# Patient Record
Sex: Male | Born: 1945 | Race: White | Hispanic: No | Marital: Married | Smoking: Never smoker
Health system: Southern US, Community
[De-identification: ages and names within clinical notes are randomized; demographics above are authoritative.]

## PROBLEM LIST (undated history)

## (undated) DIAGNOSIS — F319 Bipolar disorder, unspecified: Secondary | ICD-10-CM

## (undated) DIAGNOSIS — K219 Gastro-esophageal reflux disease without esophagitis: Secondary | ICD-10-CM

## (undated) DIAGNOSIS — L570 Actinic keratosis: Secondary | ICD-10-CM

## (undated) DIAGNOSIS — I10 Essential (primary) hypertension: Secondary | ICD-10-CM

## (undated) HISTORY — DX: Actinic keratosis: L57.0

## (undated) HISTORY — PX: HERNIA REPAIR: SHX51

## (undated) HISTORY — PX: COLONOSCOPY: SHX174

## (undated) HISTORY — PX: CYST EXCISION: SHX5701

---

## 2005-10-30 ENCOUNTER — Emergency Department: Payer: Self-pay | Admitting: Emergency Medicine

## 2006-02-08 ENCOUNTER — Ambulatory Visit: Payer: Self-pay | Admitting: Internal Medicine

## 2011-01-16 ENCOUNTER — Ambulatory Visit: Payer: Self-pay | Admitting: Internal Medicine

## 2012-01-30 ENCOUNTER — Emergency Department: Payer: Self-pay | Admitting: Internal Medicine

## 2012-02-06 ENCOUNTER — Emergency Department: Payer: Self-pay | Admitting: Emergency Medicine

## 2012-10-11 ENCOUNTER — Ambulatory Visit: Payer: Self-pay | Admitting: Neurology

## 2018-02-21 ENCOUNTER — Other Ambulatory Visit: Payer: Self-pay | Admitting: Pathology

## 2018-02-21 DIAGNOSIS — R55 Syncope and collapse: Secondary | ICD-10-CM

## 2018-02-25 ENCOUNTER — Other Ambulatory Visit: Payer: Self-pay

## 2018-02-25 ENCOUNTER — Ambulatory Visit (HOSPITAL_COMMUNITY): Payer: No Typology Code available for payment source | Attending: Cardiology

## 2018-02-25 DIAGNOSIS — R55 Syncope and collapse: Secondary | ICD-10-CM | POA: Insufficient documentation

## 2019-03-06 DIAGNOSIS — D239 Other benign neoplasm of skin, unspecified: Secondary | ICD-10-CM

## 2019-03-06 HISTORY — DX: Other benign neoplasm of skin, unspecified: D23.9

## 2019-04-20 ENCOUNTER — Ambulatory Visit: Payer: Non-veteran care | Attending: Internal Medicine

## 2019-04-20 DIAGNOSIS — Z23 Encounter for immunization: Secondary | ICD-10-CM

## 2019-04-20 NOTE — Progress Notes (Signed)
   Covid-19 Vaccination Clinic  Name:  Zachary Gardner    MRN: [id number] DOB: [birth year]  04/20/2019  [facility] was observed post Covid-19 immunization for 15 minutes without incidence. He was provided with Vaccine Information Sheet and instruction to access the V-Safe system.   [facility] was instructed to call 911 with any severe reactions post vaccine: Marland Kitchen Difficulty breathing  . Swelling of your face and throat  . A fast heartbeat  . A bad rash all over your body  . Dizziness and weakness    Immunizations Administered    Name Date Dose VIS Date Route   Moderna COVID-19 Vaccine 04/20/2019  3:01 PM 0.5 mL 01/27/2019 Intramuscular   Manufacturer: Moderna   Lot: [id number]   BartlettPO:[id number]

## 2019-05-19 ENCOUNTER — Ambulatory Visit: Payer: Non-veteran care | Attending: Internal Medicine

## 2019-05-19 DIAGNOSIS — Z23 Encounter for immunization: Secondary | ICD-10-CM

## 2019-05-19 NOTE — Progress Notes (Signed)
   Covid-19 Vaccination Clinic  Name:  Z[facility]    MRN: [id number] DOB: Mar 20, 1945  05/19/2019  [facility] was observed post Covid-19 immunization for 15 minutes without incident. He was provided with Vaccine Information Sheet and instruction to access the V-Safe system.   [facility] was instructed to call 911 with any severe reactions post vaccine: Marland Kitchen Difficulty breathing  . Swelling of face and throat  . A fast heartbeat  . A bad rash all over body  . Dizziness and weakness   Immunizations Administered    Name Date Dose VIS Date Route   Moderna COVID-19 Vaccine 05/19/2019  3:23 PM 0.5 mL 01/27/2019 Intramuscular   Manufacturer: Levan Hurst   LotUT:[id number]   WilsonPO:[id number]

## 2019-08-14 ENCOUNTER — Encounter: Payer: Self-pay | Admitting: *Deleted

## 2019-08-14 ENCOUNTER — Other Ambulatory Visit: Payer: Self-pay

## 2019-08-14 DIAGNOSIS — R339 Retention of urine, unspecified: Secondary | ICD-10-CM | POA: Insufficient documentation

## 2019-08-14 LAB — URINALYSIS, COMPLETE (UACMP) WITH MICROSCOPIC
Bacteria, UA: NONE SEEN
Bilirubin Urine: NEGATIVE
Glucose, UA: NEGATIVE mg/dL
Ketones, ur: NEGATIVE mg/dL
Leukocytes,Ua: NEGATIVE
Nitrite: NEGATIVE
Protein, ur: NEGATIVE mg/dL
Specific Gravity, Urine: 1.014 (ref 1.005–1.030)
Squamous Epithelial / HPF: NONE SEEN (ref 0–5)
pH: 6 (ref 5.0–8.0)

## 2019-08-14 NOTE — ED Triage Notes (Signed)
Pt to ED after a hernia repair and has arrived to ED after 5 hours of urinary retention. Pt has been able to urinate a couple drops but is having pressure in his penis more specifically the tip. No blood in urine.

## 2019-08-15 ENCOUNTER — Emergency Department
Admission: EM | Admit: 2019-08-15 | Discharge: 2019-08-15 | Disposition: A | Discharge status: Home / Self Care | Payer: No Typology Code available for payment source | Attending: Emergency Medicine | Admitting: Emergency Medicine

## 2019-08-15 DIAGNOSIS — R338 Other retention of urine: Secondary | ICD-10-CM

## 2019-08-15 NOTE — ED Notes (Signed)
Reviewed discharge instructions, follow-up care, and catheter care with patient. Patient verbalized understanding of all information reviewed. Patient stable, with no distress noted at this time.

## 2019-08-15 NOTE — ED Provider Notes (Signed)
[facility] Emergency Department Provider Note  ____________________________________________   First MD Initiated Contact with Patient 08/15/19 0109     (approximate)  I have reviewed the triage vital signs and the nursing notes.   HISTORY  Chief Complaint Urinary Retention    HPI [facility] is a 74 y.o. male who presents for evaluation of inability to urinate.  He states that he had left inguinal hernia surgery at the [place] this morning.  He is not certain if the had him urinate before he left.  However since he came home he has been unable to urinate.  By the time he came to the emergency department he was in severe distress due to suprapubic pain and pressure and abdominal distention.  He has no other symptoms and said he seems to be doing well from surgery.  He denies nausea and vomiting, fever, chest pain, and shortness of breath.  He has had issues with urination in the past but never been obstructed or had retention requiring an indwelling Foley catheter.  Nothing in particular made the symptoms better or worse.  He had a Foley catheter placed in triage and immediate return of about 800 mL of urine and now he feels asymptomatic.         History reviewed. No pertinent past medical history.  There are no problems to display for this patient.   Past Surgical History:  Procedure Laterality Date  . HERNIA REPAIR  2004    Prior to Admission medications   Not on File    Allergies Patient has no known allergies.  History reviewed. No pertinent family history.  Social History Social History   Tobacco Use  . Smoking status: Never Smoker  . Smokeless tobacco: Never Used  Substance Use Topics  . Alcohol use: Never  . Drug use: Never    Review of Systems Constitutional: No fever/chills Eyes: No visual changes. ENT: No sore throat. Cardiovascular: Denies chest pain. Respiratory: Denies shortness of breath. Gastrointestinal: Suprapubic  pain and abdominal distention.  No nausea, no vomiting.  No diarrhea.  No constipation. Genitourinary: Urinary retention and lower abdominal distention.  Negative for dysuria. Musculoskeletal: Negative for neck pain.  Negative for back pain. Integumentary: Negative for rash. Neurological: Negative for headaches, focal weakness or numbness.   ____________________________________________   PHYSICAL EXAM:  VITAL SIGNS: ED Triage Vitals  Enc Vitals Group     BP 08/14/19 2012 (!) 151/86     Pulse Rate 08/14/19 2012 78     Resp 08/14/19 2012 16     Temp 08/14/19 2012 98.5 F (36.9 C)     Temp Source 08/14/19 2012 Oral     SpO2 08/14/19 2012 98 %     Weight 08/14/19 2013 68.5 kg (151 lb)     Height 08/14/19 2013 1.727 m (5\' 8" )     Head Circumference --      Peak Flow --      Pain Score 08/14/19 2013 7     Pain Loc --      Pain Edu? --      Excl. in [place]? --     Constitutional: Alert and oriented.  Eyes: Conjunctivae are normal.  Head: Atraumatic. Nose: No congestion/rhinnorhea. Mouth/Throat: Patient is wearing a mask. Neck: No stridor.  No meningeal signs.   Cardiovascular: Normal rate, regular rhythm. Good peripheral circulation. Grossly normal heart sounds. Respiratory: Normal respiratory effort.  No retractions. Gastrointestinal: Soft and nontender. No distention.  Genitourinary: Foley catheter  is in place and drained more than 800 mL of normal-appearing urine.  The recent surgical wound in the left groin appears well, no dehiscence, no bleeding. Musculoskeletal: No lower extremity tenderness nor edema. No gross deformities of extremities. Neurologic:  Normal speech and language. No gross focal neurologic deficits are appreciated.  Skin:  Skin is warm, dry and intact. Psychiatric: Mood and affect are normal. Speech and behavior are normal.  ____________________________________________   LABS (all labs ordered are listed, but only abnormal results are displayed)  Labs  Reviewed  URINALYSIS, COMPLETE (UACMP) WITH MICROSCOPIC - Abnormal; Notable for the following components:      Result Value   Color, Urine YELLOW (*)    APPearance CLEAR (*)    Hgb urine dipstick SMALL (*)    All other components within normal limits   ____________________________________________  EKG  No indication for emergent EKG ____________________________________________  RADIOLOGY I, Hinda Kehr, personally viewed and evaluated these images (plain radiographs) as part of my medical decision making, as well as reviewing the written report by the radiologist.  ED MD interpretation: No indication for emergent imaging  Official radiology report(s): No results found.  ____________________________________________   PROCEDURES   Procedure(s) performed (including Critical Care):  Procedures   ____________________________________________   INITIAL IMPRESSION / MDM / [place] / ED COURSE  As part of my medical decision making, I reviewed the following data within the [place] notes reviewed and incorporated, Old chart reviewed and Notes from prior ED visits   Differential diagnosis includes, but is not limited to, urinary retention after recent anesthesia, prostate pathology, UTI.  I believe the symptoms are the result of the patient's general anesthesia.  He is asymptomatic with an indwelling catheter in place.  He drained a large amount of urine.  We had my usual and customary discussion about urinary retention and Foley catheter placement and the need for outpatient follow-up.  He understands and agrees with the plan.  No indication for antibiotics based on urinalysis which showed a few RBCs, likely traumatic, but no indication of infection and he was having no dysuria prior to the episode.           ____________________________________________  FINAL CLINICAL IMPRESSION(S) / ED DIAGNOSES  Final diagnoses:  Acute urinary  retention     MEDICATIONS GIVEN DURING THIS VISIT:  Medications - No data to display   ED Discharge Orders    None      *Please note:  Zachary Gardner was evaluated in Emergency Department on 08/15/2019 for the symptoms described in the history of present illness. He was evaluated in the context of the global COVID-19 pandemic, which necessitated consideration that the patient might be at risk for infection with the SARS-CoV-2 virus that causes COVID-19. Institutional protocols and algorithms that pertain to the evaluation of patients at risk for COVID-19 are in a state of rapid change based on information released by regulatory bodies including the CDC and federal and state organizations. These policies and algorithms were followed during the patient's care in the ED.  Some ED evaluations and interventions may be delayed as a result of limited staffing during and after the pandemic.*  Note:  This document was prepared using Dragon voice recognition software and may include unintentional dictation errors.   Hinda Kehr, MD 08/15/19 [phone]

## 2019-08-15 NOTE — Discharge Instructions (Signed)
You were seen in the Emergency Department (ED) for urinary retention.  We placed a Foley catheter to help your bladder drain.  Please read through the included information and follow up with your doctor as recommended in these papers; your doctor will see you in clinic and help you determine when it is time to have the catheter removed.  If you stop producing urine in the bag or if you develop other symptoms that concern you, such as fever, chills, persistent vomiting, or severe abdominal pain, please return immediately to the Emergency Department.  ° °

## 2019-08-26 ENCOUNTER — Ambulatory Visit: Payer: Medicare HMO | Admitting: Urology

## 2019-08-26 ENCOUNTER — Other Ambulatory Visit: Payer: Self-pay

## 2019-08-26 ENCOUNTER — Encounter: Payer: Self-pay | Admitting: Urology

## 2019-08-26 ENCOUNTER — Ambulatory Visit (INDEPENDENT_AMBULATORY_CARE_PROVIDER_SITE_OTHER): Payer: Medicare HMO | Admitting: Physician Assistant

## 2019-08-26 VITALS — BP 163/75 | HR 76 | Ht 68.0 in | Wt 155.0 lb

## 2019-08-26 DIAGNOSIS — N401 Enlarged prostate with lower urinary tract symptoms: Secondary | ICD-10-CM | POA: Diagnosis not present

## 2019-08-26 DIAGNOSIS — R339 Retention of urine, unspecified: Secondary | ICD-10-CM

## 2019-08-26 LAB — BLADDER SCAN AMB NON-IMAGING: Scan Result: 512

## 2019-08-26 NOTE — Progress Notes (Signed)
Simple Catheter Placement  Due to urinary retention patient is present today for a foley cath placement.  Patient was cleaned and prepped in a sterile fashion with betadine and 2% lidocaine jelly was instilled into the urethra. A 16 FR coude foley catheter was inserted, urine return was noted  438ml, urine was yellow in color.  The balloon was filled with 10cc of sterile water.  A leg bag was attached for drainage. Patient was also given a night bag to take home and was given instruction on how to change from one bag to another.  Patient was given instruction on proper catheter care.  Patient tolerated well, no complications were noted   Performed by: Debroah Loop, PA-C

## 2019-08-26 NOTE — Progress Notes (Signed)
Afternoon follow-up  Patient returned to clinic this afternoon for repeat PVR. He reports drinking approximately 32oz of fluid. He has been able to urinate with decreased output with each void throughout the day. PVR 579mL; he reports significant penile and perineal pain since catheter removal.  Results for orders placed or performed in visit on 08/26/19  BLADDER SCAN AMB NON-IMAGING  Result Value Ref Range   Scan Result 512 ML     Voiding trial failed. Offered patient Foley replacement with repeat voiding trial on silodosin in one week. He agreed; see separate procedure note for details. Urine sample obtained with reassuring UA. Patient reports resolution of pain with repeat catheterization.  Follow up: Return in about 1 week (around 09/02/2019) for Voiding trial.

## 2019-08-26 NOTE — Patient Instructions (Signed)
Indwelling Urinary Catheter Care, Adult An indwelling urinary catheter is a thin, flexible, germ-free (sterile) tube that is placed into the bladder to help drain urine out of the body. The catheter is inserted into the part of the body that drains urine from the bladder (urethra). Urine drains from the catheter into a drainage bag outside of the body. Taking good care of your catheter will keep it working properly and help to prevent problems from developing. What are the risks?  Bacteria may get into your bladder and cause a urinary tract infection.  Urine flow can become blocked. This can happen if the catheter is not working correctly, or if you have sediment or a blood clot in your bladder or the catheter.  Tissue near the catheter may become irritated and bleed. How to wear your catheter and your drainage bag Supplies needed  Adhesive tape or a leg strap.  Alcohol wipe or soap and water (if you use tape).  A clean towel (if you use tape).  Overnight drainage bag.  Smaller drainage bag (leg bag). Wearing your catheter and bag Use adhesive tape or a leg strap to attach your catheter to your leg.  Make sure the catheter is not pulled tight.  If a leg strap gets wet, replace it with a dry one.  If you use adhesive tape: 1. Use an alcohol wipe or soap and water to wash off any stickiness on your skin where you had tape before. 2. Use a clean towel to pat-dry the area. 3. Apply the new tape. You should have received a large overnight drainage bag and a smaller leg bag that fits underneath clothing.  You may wear the overnight bag at any time, but you should not wear the leg bag at night.  Always wear the leg bag below your knee.  Make sure the overnight drainage bag is always lower than the level of your bladder, but do not let it touch the floor. Before you go to sleep, hang the bag inside a wastebasket that is covered by a clean plastic bag. How to care for your skin around  the catheter     Supplies needed  A clean washcloth.  Water and mild soap.  A clean towel. Caring for your skin and catheter  Every day, use a clean washcloth and soapy water to clean the skin around your catheter. 1. Wash your hands with soap and water. 2. Wet a washcloth in warm water and mild soap. 3. Clean the skin around your urethra.  If you are male:  Use one hand to gently spread the folds of skin around your vagina (labia).  With the washcloth in your other hand, wipe the inner side of your labia on each side. Do this in a front-to-back direction.  If you are male:  Use one hand to pull back any skin that covers the end of your penis (foreskin).  With the washcloth in your other hand, wipe your penis in small circles. Start wiping at the tip of your penis, then move outward from the catheter.  Move the foreskin back in place, if this applies. 4. With your free hand, hold the catheter close to where it enters your body. Keep holding the catheter during cleaning so it does not get pulled out. 5. Use your other hand to clean the catheter with the washcloth.  Only wipe downward on the catheter.  Do not wipe upward toward your body, because that may push bacteria into your urethra   and cause infection. 6. Use a clean towel to pat-dry the catheter and the skin around it. Make sure to wipe off all soap. 7. Wash your hands with soap and water.  Shower every day. Do not take baths.  Do not use cream, ointment, or lotion on the area where the catheter enters your body, unless your health care provider tells you to do that.  Do not use powders, sprays, or lotions on your genital area.  Check your skin around the catheter every day for signs of infection. Check for: ? Redness, swelling, or pain. ? Fluid or blood. ? Warmth. ? Pus or a bad smell. How to empty the drainage bag Supplies needed  Rubbing alcohol.  Gauze pad or cotton ball.  Adhesive tape or a leg  strap. Emptying the bag Empty your drainage bag (your overnight drainage bag or your leg bag) when it is ?- full, or at least 2-3 times a day. Clean the drainage bag according to the manufacturer's instructions or as told by your health care provider. 1. Wash your hands with soap and water. 2. Detach the drainage bag from your leg. 3. Hold the drainage bag over the toilet or a clean container. Make sure the drainage bag is lower than your hips and bladder. This stops urine from going back into the tubing and into your bladder. 4. Open the pour spout at the bottom of the bag. 5. Empty the urine into the toilet or container. Do not let the pour spout touch any surface. This precaution is important to prevent bacteria from getting in the bag and causing infection. 6. Apply rubbing alcohol to a gauze pad or cotton ball. 7. Use the gauze pad or cotton ball to clean the pour spout. 8. Close the pour spout. 9. Attach the bag to your leg with adhesive tape or a leg strap. 10. Wash your hands with soap and water. How to change the drainage bag Supplies needed:  Alcohol wipes.  A clean drainage bag.  Adhesive tape or a leg strap. Changing the bag Replace your drainage bag with a clean bag if it leaks, starts to smell bad, or looks dirty. 1. Wash your hands with soap and water. 2. Detach the dirty drainage bag from your leg. 3. Pinch the catheter with your fingers so that urine does not spill out. 4. Disconnect the catheter tube from the drainage tube at the connection valve. Do not let the tubes touch any surface. 5. Clean the end of the catheter tube with an alcohol wipe. Use a different alcohol wipe to clean the end of the drainage tube. 6. Connect the catheter tube to the drainage tube of the clean bag. 7. Attach the clean bag to your leg with adhesive tape or a leg strap. Avoid attaching the new bag too tightly. 8. Wash your hands with soap and water. General instructions   Never pull on  your catheter or try to remove it. Pulling can damage your internal tissues.  Always wash your hands before and after you handle your catheter or drainage bag. Use a mild, fragrance-free soap. If soap and water are not available, use hand sanitizer.  Always make sure there are no twists or bends (kinks) in the catheter tube.  Always make sure there are no leaks in the catheter or drainage bag.  Drink enough fluid to keep your urine pale yellow.  Do not take baths, swim, or use a hot tub.  If you are male, wipe from   front to back after having a bowel movement. Contact a health care provider if:  Your urine is cloudy.  Your urine smells unusually bad.  Your catheter gets clogged.  Your catheter starts to leak.  Your bladder feels full. Get help right away if:  You have redness, swelling, or pain where the catheter enters your body.  You have fluid, blood, pus, or a bad smell coming from the area where the catheter enters your body.  The area where the catheter enters your body feels warm to the touch.  You have a fever.  You have pain in your abdomen, legs, lower back, or bladder.  You see blood in the catheter.  Your urine is pink or red.  You have nausea, vomiting, or chills.  Your urine is not draining into the bag.  Your catheter gets pulled out. Summary  An indwelling urinary catheter is a thin, flexible, germ-free (sterile) tube that is placed into the bladder to help drain urine out of the body.  The catheter is inserted into the part of the body that drains urine from the bladder (urethra).  Take good care of your catheter to keep it working properly and help prevent problems from developing.  Always wash your hands before and after you handle your catheter or drainage bag.  Never pull on your catheter or try to remove it. This information is not intended to replace advice given to you by your health care provider. Make sure you discuss any questions  you have with your health care provider. Document Revised: 06/06/2018 Document Reviewed: 09/28/2016 Elsevier Patient Education  2020 Elsevier Inc.  

## 2019-08-26 NOTE — Progress Notes (Signed)
° °  08/26/19 8:33 AM   Zachary Gardner [birth year] [id number]  Referring provider: Keane Police, MD [address] [place],  [place] [address] Chief Complaint  Patient presents with   Urinary Retention    HPI: Zachary Gardner is a 74 y.o. male who presents today for urinary retention follow up.  -Underwent a left inquinal hernia repair at the [facility] [place] on 08/15/19 -Presented to in ED for acute urinary retention and foley catheter was placed -Prior history of BPH, was on tamsulosin and finasteride -Was taken off tamsulosin approximally 15 months ago and did note increased frequency and nocturia after this medication was discontinued -No prior episodes of urinary retention   PMH: No past medical history on file.  Surgical History: Past Surgical History:  Procedure Laterality Date   HERNIA REPAIR  2004    Home Medications:  Allergies as of 08/26/2019   No Known Allergies     Medication List       Accurate as of August 26, 2019  8:33 AM. If you have any questions, ask your nurse or doctor.        finasteride 5 MG tablet Commonly known as: PROSCAR   loperamide 2 MG capsule Commonly known as: IMODIUM Take by mouth.   Omeprazole 20 MG Tbec       Allergies: No Known Allergies  Family History: No family history on file.  Social History:  reports that he has never smoked. He has never used smokeless tobacco. He reports that he does not drink alcohol and does not use drugs.   Physical Exam: BP (!) 163/75    Pulse 76    Ht 5\' 8"  (1.727 m)    Wt 155 lb (70.3 kg)    BMI 23.57 kg/m   Constitutional:  Alert and oriented, No acute distress. HEENT: [facility] AT, moist mucus membranes.  Trachea midline, no masses. Cardiovascular: No clubbing, cyanosis, or edema. Respiratory: Normal respiratory effort, no increased work of breathing. GI: Abdomen is soft, nontender, nondistended, no abdominal masses GU: No CVA tenderness. Penis without lesions. Testes descended bilaterally, no masses  or tenderness. Foley catheter drained, clear urine. Prostate is 60+ grams, smooth, no nodules. Lymph: No cervical or inguinal lymphadenopathy. Skin: No rashes, bruises or suspicious lesions. Neurologic: Grossly intact, no focal deficits, moving all 4 extremities. Psychiatric: Normal mood and affect.   Assessment & Plan:   1. 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 Nicki Reaper C. Ulises Wolfinger.  I have reviewed the above documentation for accuracy and completeness, and I agree with the above.   Abbie Sons, MD

## 2019-08-27 ENCOUNTER — Encounter: Payer: Self-pay | Admitting: Urology

## 2019-08-27 LAB — MICROSCOPIC EXAMINATION: Bacteria, UA: NONE SEEN

## 2019-08-27 LAB — URINALYSIS, COMPLETE
Bilirubin, UA: NEGATIVE
Glucose, UA: NEGATIVE
Ketones, UA: NEGATIVE
Leukocytes,UA: NEGATIVE
Nitrite, UA: NEGATIVE
Protein,UA: NEGATIVE
Specific Gravity, UA: 1.01 (ref 1.005–1.030)
Urobilinogen, Ur: 0.2 mg/dL (ref 0.2–1.0)
pH, UA: 7 (ref 5.0–7.5)

## 2019-08-27 MED ORDER — SILODOSIN 8 MG PO CAPS: 8.0000 mg | ORAL_CAPSULE | Freq: Every day | ORAL | 3 refills | Status: DC

## 2019-09-03 ENCOUNTER — Ambulatory Visit: Payer: Medicare HMO | Admitting: Physician Assistant

## 2019-09-03 ENCOUNTER — Other Ambulatory Visit: Payer: Self-pay

## 2019-09-03 ENCOUNTER — Encounter: Payer: Self-pay | Admitting: Physician Assistant

## 2019-09-03 ENCOUNTER — Ambulatory Visit: Payer: Self-pay | Admitting: Physician Assistant

## 2019-09-03 VITALS — BP 153/93 | HR 73 | Ht 68.0 in | Wt 153.2 lb

## 2019-09-03 DIAGNOSIS — R600 Localized edema: Secondary | ICD-10-CM

## 2019-09-03 DIAGNOSIS — R339 Retention of urine, unspecified: Secondary | ICD-10-CM

## 2019-09-03 LAB — BLADDER SCAN AMB NON-IMAGING: Scan Result: 42

## 2019-09-03 NOTE — Progress Notes (Signed)
09/03/2019 4:32 PM   Zachary Gardner [birth year] [id number]  CC: Chief Complaint  Patient presents with  . Urinary Retention    HPI: Zachary Gardner is a 74 y.o. male with a recent history of urinary retention following left inguinal hernia repair at the [facility] who presents today for repeat voiding trial on finasteride and silodosin.  Today, patient reports tolerating his catheter well.  He is excited to see if he can urinate today.  Additionally, he reports right lower extremity edema x1 week with tenderness over the medial tibial plateau.  Notably, his catheter leg bag has been strapped to his right leg with an elastic strap just inferior to the patella.  PMH: No past medical history on file.  Surgical History: Past Surgical History:  Procedure Laterality Date  . HERNIA REPAIR  2004    Home Medications:  Allergies as of 09/03/2019   No Known Allergies     Medication List       Accurate as of September 03, 2019  4:32 PM. If you have any questions, ask your nurse or doctor.        cyanocobalamin 1000 MCG tablet Take 1 tablet by mouth daily.   finasteride 5 MG tablet Commonly known as: PROSCAR   lithium 300 MG tablet Take by mouth.   loperamide 2 MG capsule Commonly known as: IMODIUM Take by mouth.   Omeprazole 20 MG Tbec   silodosin 8 MG Caps capsule Commonly known as: RAPAFLO Take 1 capsule (8 mg total) by mouth daily with breakfast.   simvastatin 40 MG tablet Commonly known as: ZOCOR Take by mouth.   traZODone 50 MG tablet Commonly known as: DESYREL Take by mouth.   Vitamin D3 1.25 MG (50000 UT) Caps Take by mouth.       Allergies:  No Known Allergies  Family History: No family history on file.  Social History:   reports that he has never smoked. He has never used smokeless tobacco. He reports that he does not drink alcohol and does not use drugs.  Physical Exam: BP (!) 153/93 (BP Location: Left Arm, Patient Position: Sitting, Cuff Size:  Normal)   Pulse 73   Ht 5\' 8"  (1.727 m)   Wt 153 lb 3.2 oz (69.5 kg)   BMI 23.29 kg/m   Constitutional:  Alert and oriented, no acute distress, nontoxic appearing HEENT: [place], AT Cardiovascular: No clubbing, cyanosis, or edema Respiratory: Normal respiratory effort, no increased work of breathing Skin: No rashes, bruises or suspicious lesions MSK: 2+ pitting edema of the right lower extremity below the level of the knee.  No notable erythema.  Some tenderness over the right medial tibial plateau. Neurologic: Grossly intact, no focal deficits, moving all 4 extremities Psychiatric: Normal mood and affect  Laboratory Data: Results for orders placed or performed in visit on 09/03/19  Bladder Scan (Post Void Residual) in office  Result Value Ref Range   Scan Result 42    Assessment & Plan:   1. Urinary retention Fill and pull voiding trial completed in the morning, see separate procedure note for details.  Patient returned to clinic this afternoon for repeat PVR. He reports drinking approximately 70oz of fluid. He has been able to urinate. PVR 24mL.  Voiding trial passed. Counseled patient to continue silodosin and finasteride daily.  We will plan for symptom recheck with PVR with Dr. Bernardo Heater in approximately 3 months.  Additionally, I counseled the patient that his history of urinary retention increases risk  for retention again in the future.  Counseled him to make sure that he is always taking both silodosin and finasteride, especially prior to surgery, and to make any future surgical teams aware of his history of retention.  He expressed understanding. - Bladder Scan (Post Void Residual) in office  2. Edema of right lower extremity No improvement in edema following removal of his catheter leg bag.  I encouraged him to follow-up with his PCP ASAP given his recent surgery and concern for possible DVT.  He expressed understanding.  Return in about 3 months (around 12/04/2019) for Symptom  recheck with IPSS, PVR with Dr. Bernardo Heater.  Debroah Loop, PA-C  [facility] [address] [place], [place] [address] [phone]

## 2019-09-03 NOTE — Progress Notes (Signed)
Fill and Pull Catheter Removal  Patient is present today for a catheter removal.  Patient was cleaned and prepped in a sterile fashion 282ml of sterile water was instilled into the bladder when the patient felt the urge to urinate. 24ml of water was then drained from the balloon.  A 16FR coude foley cath was removed from the bladder no complications were noted.  Patient was then given some time to void on their own.  Patient can void  274ml on their own after some time.  Patient tolerated well.  Performed by: Debroah Loop, PA-C   Follow up/ Additional notes: Push fluids and RTC this afternoon for PVR.

## 2019-09-09 ENCOUNTER — Ambulatory Visit: Payer: Self-pay | Admitting: Physician Assistant

## 2019-09-25 ENCOUNTER — Ambulatory Visit: Payer: Self-pay | Admitting: Urology

## 2019-10-13 ENCOUNTER — Other Ambulatory Visit: Payer: Self-pay

## 2019-10-13 ENCOUNTER — Encounter: Payer: Self-pay | Admitting: Physician Assistant

## 2019-10-13 ENCOUNTER — Ambulatory Visit (INDEPENDENT_AMBULATORY_CARE_PROVIDER_SITE_OTHER): Payer: No Typology Code available for payment source | Admitting: Physician Assistant

## 2019-10-13 VITALS — BP 134/71 | HR 78 | Ht 68.0 in | Wt 153.0 lb

## 2019-10-13 DIAGNOSIS — R339 Retention of urine, unspecified: Secondary | ICD-10-CM | POA: Diagnosis not present

## 2019-10-13 DIAGNOSIS — Z87898 Personal history of other specified conditions: Secondary | ICD-10-CM

## 2019-10-13 DIAGNOSIS — R300 Dysuria: Secondary | ICD-10-CM

## 2019-10-13 LAB — URINALYSIS, COMPLETE
Bilirubin, UA: NEGATIVE
Glucose, UA: NEGATIVE
Ketones, UA: NEGATIVE
Nitrite, UA: POSITIVE — AB
Specific Gravity, UA: 1.02 (ref 1.005–1.030)
Urobilinogen, Ur: 0.2 mg/dL (ref 0.2–1.0)
pH, UA: 7.5 (ref 5.0–7.5)

## 2019-10-13 LAB — MICROSCOPIC EXAMINATION: WBC, UA: >30 /hpf — AB (ref 0–5)

## 2019-10-13 LAB — BLADDER SCAN AMB NON-IMAGING

## 2019-10-13 MED ORDER — SULFAMETHOXAZOLE-TRIMETHOPRIM 800-160 MG PO TABS: 1.0000 | ORAL_TABLET | Freq: Two times a day (BID) | ORAL | 0 refills | Status: AC

## 2019-10-13 NOTE — Progress Notes (Signed)
10/13/2019 10:40 AM   Zachary Gardner January 19, 1946 [id number]  CC: Chief Complaint  Patient presents with  . Dysuria    UTI?    HPI: Zachary Gardner is a 74 y.o. male with PMH BPH on finasteride and silodosin and a recent history of postoperative urinary retention who passed a voiding trial on 09/03/2019 who presents today for evaluation of possible UTI.  Today he reports an approximate 3 to 4-week history of dysuria, urgency, frequency, occasional nausea, and cloudy urine.  He denies fever, chills, and vomiting.  Per chart review, most recent creatinine dated 03/27/2019 was 1.2.  In-office UA today positive for 2+ blood, trace protein, nitrites, and 3+ leukocyte esterase; urine microscopy with >30 WBCs/HPF, 3-10 RBCs/HPF, and many bacteria. PVR 62mL.  PMH: No past medical history on file.  Surgical History: Past Surgical History:  Procedure Laterality Date  . HERNIA REPAIR  2004    Home Medications:  Allergies as of 10/13/2019   No Known Allergies     Medication List       Accurate as of October 13, 2019 10:40 AM. If you have any questions, ask your nurse or doctor.        cyanocobalamin 1000 MCG tablet Take 1 tablet by mouth daily.   finasteride 5 MG tablet Commonly known as: PROSCAR   lithium 300 MG tablet Take by mouth.   loperamide 2 MG capsule Commonly known as: IMODIUM Take by mouth.   Omeprazole 20 MG Tbec   silodosin 8 MG Caps capsule Commonly known as: RAPAFLO Take 1 capsule (8 mg total) by mouth daily with breakfast.   simvastatin 40 MG tablet Commonly known as: ZOCOR Take by mouth.   sulfamethoxazole-trimethoprim 800-160 MG tablet Commonly known as: BACTRIM DS Take 1 tablet by mouth 2 (two) times daily for 7 days. Started by: Debroah Loop, PA-C   traZODone 50 MG tablet Commonly known as: DESYREL Take by mouth.   Vitamin D3 1.25 MG (50000 UT) Caps Take by mouth.       Allergies:  No Known Allergies  Family History: No  family history on file.  Social History:   reports that he has never smoked. He has never used smokeless tobacco. He reports that he does not drink alcohol and does not use drugs.  Physical Exam: BP 134/71   Pulse 78   Ht 5\' 8"  (1.727 m)   Wt 153 lb (69.4 kg)   BMI 23.26 kg/m   Constitutional:  Alert and oriented, no acute distress, nontoxic appearing HEENT: [name], AT Cardiovascular: No clubbing, cyanosis, or edema Respiratory: Normal respiratory effort, no increased work of breathing Skin: No rashes, bruises or suspicious lesions Neurologic: Grossly intact, no focal deficits, moving all 4 extremities Psychiatric: Normal mood and affect  Laboratory Data: Results for orders placed or performed in visit on 10/13/19  Microscopic Examination   Urine  Result Value Ref Range   WBC, UA >30 (A) 0 - 5 /hpf   RBC 3-10 (A) 0 - 2 /hpf   Epithelial Cells (non renal) 0-10 0 - 10 /hpf   Bacteria, UA Many (A) None seen/Few  Urinalysis, Complete  Result Value Ref Range   Specific Gravity, UA 1.020 1.005 - 1.030   pH, UA 7.5 5.0 - 7.5   Color, UA Yellow Yellow   Appearance Ur Cloudy (A) Clear   Leukocytes,UA 3+ (A) Negative   Protein,UA Trace (A) Negative/Trace   Glucose, UA Negative Negative   Ketones, UA Negative Negative  RBC, UA 2+ (A) Negative   Bilirubin, UA Negative Negative   Urobilinogen, Ur 0.2 0.2 - 1.0 mg/dL   Nitrite, UA Positive (A) Negative   Microscopic Examination See below:   BLADDER SCAN AMB NON-IMAGING  Result Value Ref Range   Scan Result 28ml    Assessment & Plan:   1. Dysuria UA grossly infected today, will start empiric Bactrim and send for culture for further evaluation.  He will require repeat UA to prove resolution of microscopic hematuria in 1 week. - Urinalysis, Complete - CULTURE, URINE COMPREHENSIVE - sulfamethoxazole-trimethoprim (BACTRIM DS) 800-160 MG tablet; Take 1 tablet by mouth 2 (two) times daily for 7 days.  Dispense: 14 tablet; Refill: 0  2.  History of urinary retention PVR reassuring today, no active retention.  No intervention indicated. - BLADDER SCAN AMB NON-IMAGING   Return in about 1 week (around 10/20/2019) for Lab visit for UA.  Debroah Loop, PA-C  [facility] [address], [place], [place] [address] [phone]

## 2019-10-19 ENCOUNTER — Other Ambulatory Visit: Payer: Self-pay

## 2019-10-19 ENCOUNTER — Telehealth: Payer: Self-pay

## 2019-10-19 DIAGNOSIS — R300 Dysuria: Secondary | ICD-10-CM

## 2019-10-19 LAB — CULTURE, URINE COMPREHENSIVE

## 2019-10-19 NOTE — Telephone Encounter (Signed)
ERROR

## 2019-10-20 ENCOUNTER — Other Ambulatory Visit: Payer: Self-pay

## 2019-10-20 ENCOUNTER — Other Ambulatory Visit: Payer: No Typology Code available for payment source

## 2019-10-20 DIAGNOSIS — R300 Dysuria: Secondary | ICD-10-CM

## 2019-10-21 LAB — URINALYSIS, COMPLETE
Bilirubin, UA: NEGATIVE
Glucose, UA: NEGATIVE
Ketones, UA: NEGATIVE
Leukocytes,UA: NEGATIVE
Nitrite, UA: NEGATIVE
Protein,UA: NEGATIVE
RBC, UA: NEGATIVE
Specific Gravity, UA: 1.02 (ref 1.005–1.030)
Urobilinogen, Ur: 1 mg/dL (ref 0.2–1.0)
pH, UA: 7 (ref 5.0–7.5)

## 2019-10-21 LAB — MICROSCOPIC EXAMINATION: Bacteria, UA: NONE SEEN

## 2019-10-30 ENCOUNTER — Other Ambulatory Visit: Payer: Self-pay

## 2019-10-30 ENCOUNTER — Ambulatory Visit (INDEPENDENT_AMBULATORY_CARE_PROVIDER_SITE_OTHER): Payer: No Typology Code available for payment source | Admitting: Physician Assistant

## 2019-10-30 VITALS — BP 156/80 | HR 71

## 2019-10-30 DIAGNOSIS — R300 Dysuria: Secondary | ICD-10-CM | POA: Diagnosis not present

## 2019-10-30 LAB — URINALYSIS, COMPLETE
Bilirubin, UA: NEGATIVE
Glucose, UA: NEGATIVE
Ketones, UA: NEGATIVE
Leukocytes,UA: NEGATIVE
Nitrite, UA: NEGATIVE
Protein,UA: NEGATIVE
RBC, UA: NEGATIVE
Specific Gravity, UA: 1.02 (ref 1.005–1.030)
Urobilinogen, Ur: 0.2 mg/dL (ref 0.2–1.0)
pH, UA: 6.5 (ref 5.0–7.5)

## 2019-10-30 LAB — MICROSCOPIC EXAMINATION: Bacteria, UA: NONE SEEN

## 2019-10-30 LAB — BLADDER SCAN AMB NON-IMAGING: Scan Result: 196

## 2019-10-30 NOTE — Patient Instructions (Addendum)
Step 1 Get all of your supplies ready and place near you. Step 2 Wash your hands, or put on gloves. Step 3 Wash around the tip of your penis with warm antibacterial soapy water. Step 4 Take catheter out of package and drain the lubricant over toilet. Step 5 While holding the penis at a 45 degree angle from the stomach in one hand and the catheter in the other hand  Step 6 Insert the catheter slowly into your urethra. If there is resistance when the catheter reaches the sphincter muscle,              take a deep breath and gently apply steady pressure.              DO NOT FORCE THE CATHETER Step 7 When the urine begins to flow insert another inch and lower penis. Allow the urine to flow into the toilet. Step 8 When the flow of urine stops, slowly remove the catheter.     Cystoscopy Cystoscopy is a procedure that is used to help diagnose and sometimes treat conditions that affect the lower urinary tract. The lower urinary tract includes the bladder and the urethra. The urethra is the tube that drains urine from the bladder. Cystoscopy is done using a thin, tube-shaped instrument with a light and camera at the end (cystoscope). The cystoscope may be hard or flexible, depending on the goal of the procedure. The cystoscope is inserted through the urethra, into the bladder. Cystoscopy may be recommended if you have:  Urinary tract infections that keep coming back.  Blood in the urine (hematuria).  An inability to control when you urinate (urinary incontinence) or an overactive bladder.  Unusual cells found in a urine sample.  A blockage in the urethra, such as a urinary stone.  Painful urination.  An abnormality in the bladder found during an intravenous pyelogram (IVP) or CT scan. Cystoscopy may also be done to remove a sample of tissue to be examined under a microscope (biopsy). What are the risks? Generally, this is a safe procedure. However, problems may occur,  including:  Infection.  Bleeding.  What happens during the procedure?  1. You will be given one or more of the following: ? A medicine to numb the area (local anesthetic). 2. The area around the opening of your urethra will be cleaned. 3. The cystoscope will be passed through your urethra into your bladder. 4. Germ-free (sterile) fluid will flow through the cystoscope to fill your bladder. The fluid will stretch your bladder so that your health care provider can clearly examine your bladder walls. 5. Your doctor will look at the urethra and bladder. 6. The cystoscope will be removed The procedure may vary among health care providers  What can I expect after the procedure? After the procedure, it is common to have: 1. Some soreness or pain in your abdomen and urethra. 2. Urinary symptoms. These include: ? Mild pain or burning when you urinate. Pain should stop within a few minutes after you urinate. This may last for up to 1 week. ? A small amount of blood in your urine for several days. ? Feeling like you need to urinate but producing only a small amount of urine. Follow these instructions at home: General instructions  Return to your normal activities as told by your health care provider.   Do not drive for 24 hours if you were given a sedative during your procedure.  Watch for any blood in your  urine. If the amount of blood in your urine increases, call your health care provider.  If a tissue sample was removed for testing (biopsy) during your procedure, it is up to you to get your test results. Ask your health care provider, or the department that is doing the test, when your results will be ready.  Drink enough fluid to keep your urine pale yellow.  Keep all follow-up visits as told by your health care provider. This is important. Contact a health care provider if you:  Have pain that gets worse or does not get better with medicine, especially pain when you urinate.  Have  trouble urinating.  Have more blood in your urine. Get help right away if you:  Have blood clots in your urine.  Have abdominal pain.  Have a fever or chills.  Are unable to urinate. Summary  Cystoscopy is a procedure that is used to help diagnose and sometimes treat conditions that affect the lower urinary tract.  Cystoscopy is done using a thin, tube-shaped instrument with a light and camera at the end.  After the procedure, it is common to have some soreness or pain in your abdomen and urethra.  Watch for any blood in your urine. If the amount of blood in your urine increases, call your health care provider.  If you were prescribed an antibiotic medicine, take it as told by your health care provider. Do not stop taking the antibiotic even if you start to feel better. This information is not intended to replace advice given to you by your health care provider. Make sure you discuss any questions you have with your health care provider. Document Revised: 02/04/2018 Document Reviewed: 02/04/2018 Elsevier Patient Education  2020 Elsevier I

## 2019-10-30 NOTE — Progress Notes (Signed)
Continuous Intermittent Catheterization  Due to incomplete bladder emptying patient is present today for a teaching of self I & O Catheterization. Patient was given detailed verbal and printed instructions of self catheterization. Patient was cleaned and prepped in a sterile fashion.  With instruction and assistance patient inserted a 14FR coude Coloplast SpeediCath Standard and urine return was noted 175 ml, urine was yellow in color. Patient tolerated well, no complications were noted Patient was given a sample bag with supplies to take home.  Instructions were given for patient to cath as needed.  Performed by: Debroah Loop, PA-C

## 2019-10-30 NOTE — Progress Notes (Signed)
10/30/2019 3:42 PM   Zachary Gardner 25-Oct-1945 [id number]  CC: Chief Complaint  Patient presents with  . Urinary Tract Infection    HPI: Zachary Gardner is a 74 y.o. male with PMH BPH on finasteride and silodosin and a recent history of postoperative urinary retention who passed a voiding trial on 09/03/2019 and subsequently developed Klebsiella UTI who presents today for evaluation of possible UTI.  Today he reports an approximate 2-week history of pain with initiation of urination and urinary urgency.  He continues to take silodosin and finasteride.  In-office UA and microscopy today pan negative.  PVR 128mL.  He denies a history of cardiac or pulmonary problems.  PMH: No past medical history on file.  Surgical History: Past Surgical History:  Procedure Laterality Date  . HERNIA REPAIR  2004    Home Medications:  Allergies as of 10/30/2019   No Known Allergies     Medication List       Accurate as of October 30, 2019  3:42 PM. If you have any questions, ask your nurse or doctor.        cyanocobalamin 1000 MCG tablet Take 1 tablet by mouth daily.   finasteride 5 MG tablet Commonly known as: PROSCAR   lithium 300 MG tablet Take by mouth.   loperamide 2 MG capsule Commonly known as: IMODIUM Take by mouth.   Omeprazole 20 MG Tbec   silodosin 8 MG Caps capsule Commonly known as: RAPAFLO Take 1 capsule (8 mg total) by mouth daily with breakfast.   simvastatin 40 MG tablet Commonly known as: ZOCOR Take by mouth.   traZODone 50 MG tablet Commonly known as: DESYREL Take by mouth.   Vitamin D3 1.25 MG (50000 UT) Caps Take by mouth.       Allergies:  No Known Allergies  Family History: No family history on file.  Social History:   reports that he has never smoked. He has never used smokeless tobacco. He reports that he does not drink alcohol and does not use drugs.  Physical Exam: BP (!) 156/80   Pulse 71   Constitutional:  Alert and  oriented, no acute distress, nontoxic appearing HEENT: [place], AT Cardiovascular: No clubbing, cyanosis, or edema Respiratory: Normal respiratory effort, no increased work of breathing Skin: No rashes, bruises or suspicious lesions Neurologic: Grossly intact, no focal deficits, moving all 4 extremities Psychiatric: Normal mood and affect  Laboratory Data: Results for orders placed or performed in visit on 10/30/19  Microscopic Examination   Urine  Result Value Ref Range   WBC, UA 0-5 0 - 5 /hpf   RBC 0-2 0 - 2 /hpf   Epithelial Cells (non renal) 0-10 0 - 10 /hpf   Bacteria, UA None seen None seen/Few  Urinalysis, Complete  Result Value Ref Range   Specific Gravity, UA 1.020 1.005 - 1.030   pH, UA 6.5 5.0 - 7.5   Color, UA Yellow Yellow   Appearance Ur Clear Clear   Leukocytes,UA Negative Negative   Protein,UA Negative Negative/Trace   Glucose, UA Negative Negative   Ketones, UA Negative Negative   RBC, UA Negative Negative   Bilirubin, UA Negative Negative   Urobilinogen, Ur 0.2 0.2 - 1.0 mg/dL   Nitrite, UA Negative Negative   Microscopic Examination See below:   BLADDER SCAN AMB NON-IMAGING  Result Value Ref Range   Scan Result 196 ml    Assessment & Plan:   1. Dysuria UA benign today.  I believe his  symptoms are secondary to incomplete bladder emptying.  I offered the patient in and out catheterization today versus CIC teaching; he elected for the latter.  See separate procedure note for details.  I counseled him to use CIC supplementary to voiding to keep his bladder empty per bothersome urinary symptoms.  I explained to the patient that his incomplete bladder emptying on dual pharmacotherapy makes him a candidate for bladder outlet procedures.  He is interested in pursuing this.  No recent cross-sectional imaging available.  I recommended cystoscopy and TRUS with Dr. Bernardo Heater to discuss his surgical options.  He is in agreement with this plan. - Urinalysis, Complete -  BLADDER SCAN AMB NON-IMAGING  Return in about 1 week (around 11/06/2019) for Cysto and TRUS (bladder outlet consultation) with Dr. Bernardo Heater.  Debroah Loop, PA-C  [facility] [address], [place], [place] [address] [phone]

## 2019-11-03 ENCOUNTER — Other Ambulatory Visit: Payer: Self-pay

## 2019-11-03 ENCOUNTER — Ambulatory Visit: Payer: No Typology Code available for payment source

## 2019-11-05 ENCOUNTER — Other Ambulatory Visit: Payer: Self-pay | Admitting: Radiology

## 2019-11-05 ENCOUNTER — Encounter: Payer: Self-pay | Admitting: Urology

## 2019-11-05 ENCOUNTER — Ambulatory Visit (INDEPENDENT_AMBULATORY_CARE_PROVIDER_SITE_OTHER): Payer: No Typology Code available for payment source | Admitting: Urology

## 2019-11-05 ENCOUNTER — Other Ambulatory Visit: Payer: Self-pay

## 2019-11-05 VITALS — BP 150/80 | HR 60 | Ht 68.0 in | Wt 155.0 lb

## 2019-11-05 DIAGNOSIS — N401 Enlarged prostate with lower urinary tract symptoms: Secondary | ICD-10-CM

## 2019-11-05 DIAGNOSIS — R339 Retention of urine, unspecified: Secondary | ICD-10-CM

## 2019-11-05 NOTE — Progress Notes (Signed)
   11/05/19  CC:  Chief Complaint  Patient presents with  . Cysto   Indication: Urinary retention  HPI: Seen 08/26/2019 for cute urinary retention after left inguinal hernia repair.  History of BPH was on tamsulosin/finasteride however had been taken off tamsulosin for >1-year Started on silodosin and has failed voiding trials x3.  Currently on intermittent cath.  Does void between catheterizations  Blood pressure (!) 150/80, pulse 60, height 5\' 8"  (1.727 m), weight 155 lb (70.3 kg). NED. A&Ox3.   No respiratory distress   Abd soft, NT, ND Normal phallus with bilateral descended testicles  Cystoscopy Procedure Note  Patient identification was confirmed, informed consent was obtained, and patient was prepped using Betadine solution.  Lidocaine jelly was administered per urethral meatus.     Pre-Procedure: - Inspection reveals a normal caliber urethral meatus.  Procedure: The flexible cystoscope was introduced without difficulty - No urethral strictures/lesions are present. - Moderate lateral lobe enlargement prostate  - Mild elevation bladder neck - Bilateral ureteral orifices identified - Bladder mucosa  reveals no ulcers, tumors, or lesions - No bladder stones -Mild trabeculation  Retroflexion shows small intravesical median lobe   Post-Procedure: - Patient tolerated the procedure well    Prostate transrectal ultrasound sizing   Informed consent was obtained after discussing risks/benefits of the procedure.  A time out was performed to ensure correct patient identity.   Pre-Procedure: -Transrectal probe was placed without difficulty -Transrectal Ultrasound performed revealing a 36 gm prostate measuring 3.25 x 4.06 x 5.25 cm (length) -No significant hypoechoic or median lobe noted  -Scattered prostatic calculi     Assessment/ Plan:  Acute urinary retention after inguinal hernia repair  Failed voiding trials x3  Currently on intermittent  catheterization  We discussed minimally invasive options of UroLift and Rezum although we do not offer Rezum in [place].  TURP was also discussed.  Currently unable to perform elective surgeries that require overnight stay so TURP would be delayed  He was interested in scheduling UroLift.  The procedure was discussed in detail including the most common side effects of frequency, urgency, dysuria which persist for approximately 2 weeks.  We discussed there is no guarantee the procedure will resolve his urinary retention.  We also discussed the low incidence of sexual side effects, urethral stricture and urinary incontinence.  He was informed that placement of UroLift does not impact alternative procedures including TURP if needed in the future.  All questions were answered and he desires to proceed   Abbie Sons, MD

## 2019-11-05 NOTE — H&P (View-Only) (Signed)
   11/05/19  CC:  Chief Complaint  Patient presents with  . Cysto   Indication: Urinary retention  HPI: Seen 08/26/2019 for cute urinary retention after left inguinal hernia repair.  History of BPH was on tamsulosin/finasteride however had been taken off tamsulosin for >1-year Started on silodosin and has failed voiding trials x3.  Currently on intermittent cath.  Does void between catheterizations  Blood pressure (!) 150/80, pulse 60, height 5\' 8"  (1.727 m), weight 155 lb (70.3 kg). NED. A&Ox3.   No respiratory distress   Abd soft, NT, ND Normal phallus with bilateral descended testicles  Cystoscopy Procedure Note  Patient identification was confirmed, informed consent was obtained, and patient was prepped using Betadine solution.  Lidocaine jelly was administered per urethral meatus.     Pre-Procedure: - Inspection reveals a normal caliber urethral meatus.  Procedure: The flexible cystoscope was introduced without difficulty - No urethral strictures/lesions are present. - Moderate lateral lobe enlargement prostate  - Mild elevation bladder neck - Bilateral ureteral orifices identified - Bladder mucosa  reveals no ulcers, tumors, or lesions - No bladder stones -Mild trabeculation  Retroflexion shows small intravesical median lobe   Post-Procedure: - Patient tolerated the procedure well    Prostate transrectal ultrasound sizing   Informed consent was obtained after discussing risks/benefits of the procedure.  A time out was performed to ensure correct patient identity.   Pre-Procedure: -Transrectal probe was placed without difficulty -Transrectal Ultrasound performed revealing a 36 gm prostate measuring 3.25 x 4.06 x 5.25 cm (length) -No significant hypoechoic or median lobe noted  -Scattered prostatic calculi     Assessment/ Plan:  Acute urinary retention after inguinal hernia repair  Failed voiding trials x3  Currently on intermittent  catheterization  We discussed minimally invasive options of UroLift and Rezum although we do not offer Rezum in [place].  TURP was also discussed.  Currently unable to perform elective surgeries that require overnight stay so TURP would be delayed  He was interested in scheduling UroLift.  The procedure was discussed in detail including the most common side effects of frequency, urgency, dysuria which persist for approximately 2 weeks.  We discussed there is no guarantee the procedure will resolve his urinary retention.  We also discussed the low incidence of sexual side effects, urethral stricture and urinary incontinence.  He was informed that placement of UroLift does not impact alternative procedures including TURP if needed in the future.  All questions were answered and he desires to proceed   Abbie Sons, MD

## 2019-11-06 LAB — URINALYSIS, COMPLETE
Bilirubin, UA: NEGATIVE
Glucose, UA: NEGATIVE
Ketones, UA: NEGATIVE
Leukocytes,UA: NEGATIVE
Nitrite, UA: NEGATIVE
Protein,UA: NEGATIVE
Specific Gravity, UA: 1.015 (ref 1.005–1.030)
Urobilinogen, Ur: 0.2 mg/dL (ref 0.2–1.0)
pH, UA: 7 (ref 5.0–7.5)

## 2019-11-06 LAB — MICROSCOPIC EXAMINATION
Bacteria, UA: NONE SEEN
Epithelial Cells (non renal): NONE SEEN /hpf (ref 0–10)

## 2019-11-11 LAB — CULTURE, URINE COMPREHENSIVE

## 2019-11-12 ENCOUNTER — Other Ambulatory Visit: Payer: Self-pay

## 2019-11-12 ENCOUNTER — Encounter
Admission: RE | Admit: 2019-11-12 | Discharge: 2019-11-12 | Disposition: A | Discharge status: Home / Self Care | Payer: No Typology Code available for payment source | Source: Ambulatory Visit | Attending: Urology | Admitting: Urology

## 2019-11-12 HISTORY — DX: Gastro-esophageal reflux disease without esophagitis: K21.9

## 2019-11-12 HISTORY — DX: Essential (primary) hypertension: I10

## 2019-11-12 NOTE — Patient Instructions (Signed)
Your procedure is scheduled on: 11-17-19 TUESDAY Report to Same Day Surgery 2nd floor medical mall [facility] Entrance-take elevator on left to 2nd floor.  Check in with surgery information desk.) To find out your arrival time please call [phone] between 1PM - 3PM on 11-16-19 MONDAY  Remember: Instructions that are not followed completely may result in serious medical risk, up to and including death, or upon the discretion of your surgeon and anesthesiologist your surgery may need to be rescheduled.    _x___ 1. Do not eat food after midnight the night before your procedure. NO GUM OR CANDY AFTER MIDNIGHT. You may drink clear liquids up to 2 hours before you are scheduled to arrive at the hospital for your procedure.  Do not drink clear liquids within 2 hours of your scheduled arrival to the hospital.  Clear liquids include  --Water or Apple juice without pulp  --Gatorade  --Black Coffee or Clear Tea (No milk, no creamers, do not add anything to the coffee or Tea-OK TO ADD SUGAR)     __x__ 2. No Alcohol for 24 hours before or after surgery.   __x__3. No Smoking or e-cigarettes for 24 prior to surgery.  Do not use any chewable tobacco products for at least 6 hour prior to surgery   ____  4. Bring all medications with you on the day of surgery if instructed.    __x__ 5. Notify your doctor if there is any change in your medical condition     (cold, fever, infections).    x___6. On the morning of surgery brush your teeth with toothpaste and water.  You may rinse your mouth with mouth wash if you wish.  Do not swallow any toothpaste or mouthwash.   Do not wear jewelry, make-up, hairpins, clips or nail polish.  Do not wear lotions, powders, or perfumes. You may wear deodorant.  Do not shave 48 hours prior to surgery. Men may shave face and neck.  Do not bring valuables to the hospital.    [facility] is not responsible for any belongings or valuables.               Contacts,  dentures or bridgework may not be worn into surgery.  Leave your suitcase in the car. After surgery it may be brought to your room.  For patients admitted to the hospital, discharge time is determined by your treatment team.  _  Patients discharged the day of surgery will not be allowed to drive home.  You will need someone to drive you home and stay with you the night of your procedure.    Please read over the following fact sheets that you were given:   [facility] Preparing for Surgery   _x___ TAKE THE FOLLOWING MEDICATION THE MORNING OF SURGERY WITH A SMALL SIP OF WATER. These include:  1. PRILOSEC (OMEPRAZOLE)  2.  3.  4.  5.  6.  ____Fleets enema or Magnesium Citrate as directed.   ____ Use CHG Soap or sage wipes as directed on instruction sheet   ____ Use inhalers on the day of surgery and bring to hospital day of surgery  ____ Stop Metformin and Janumet 2 days prior to surgery.    ____ Take 1/2 of usual insulin dose the night before surgery and none on the morning surgery.   ____ Follow recommendations from Cardiologist, Pulmonologist or PCP regarding stopping Aspirin, Coumadin, Plavix ,Eliquis, Effient, or Pradaxa, and Pletal.  X____Stop Anti-inflammatories such as Advil, Aleve,  Ibuprofen, Motrin, Naproxen, Naprosyn, Goodies powders or aspirin products NOW- OK to take Tylenol    ____ Stop supplements until after surgery.    ____ Bring C-Pap to the hospital.

## 2019-11-13 ENCOUNTER — Other Ambulatory Visit
Admission: RE | Admit: 2019-11-13 | Discharge: 2019-11-13 | Disposition: A | Discharge status: Home / Self Care | Payer: No Typology Code available for payment source | Source: Ambulatory Visit | Attending: Urology | Admitting: Urology

## 2019-11-13 ENCOUNTER — Other Ambulatory Visit: Payer: No Typology Code available for payment source

## 2019-11-13 DIAGNOSIS — Z01812 Encounter for preprocedural laboratory examination: Secondary | ICD-10-CM | POA: Insufficient documentation

## 2019-11-13 DIAGNOSIS — I4510 Unspecified right bundle-branch block: Secondary | ICD-10-CM | POA: Insufficient documentation

## 2019-11-13 DIAGNOSIS — Z01810 Encounter for preprocedural cardiovascular examination: Secondary | ICD-10-CM | POA: Insufficient documentation

## 2019-11-13 DIAGNOSIS — Z20822 Contact with and (suspected) exposure to covid-19: Secondary | ICD-10-CM | POA: Insufficient documentation

## 2019-11-14 LAB — SARS CORONAVIRUS 2 (TAT 6-24 HRS): SARS Coronavirus 2: NEGATIVE

## 2019-11-17 ENCOUNTER — Encounter
Admission: RE | Disposition: A | Discharge status: Home / Self Care | Payer: Self-pay | Source: Ambulatory Visit | Attending: Urology

## 2019-11-17 ENCOUNTER — Encounter: Payer: Self-pay | Admitting: Urology

## 2019-11-17 ENCOUNTER — Ambulatory Visit: Payer: No Typology Code available for payment source | Admitting: Anesthesiology

## 2019-11-17 ENCOUNTER — Ambulatory Visit
Admission: RE | Admit: 2019-11-17 | Discharge: 2019-11-17 | Disposition: A | Discharge status: Home / Self Care | Payer: No Typology Code available for payment source | Source: Ambulatory Visit | Attending: Urology | Admitting: Urology

## 2019-11-17 ENCOUNTER — Other Ambulatory Visit: Payer: Self-pay

## 2019-11-17 DIAGNOSIS — I10 Essential (primary) hypertension: Secondary | ICD-10-CM | POA: Insufficient documentation

## 2019-11-17 DIAGNOSIS — Z9889 Other specified postprocedural states: Secondary | ICD-10-CM | POA: Diagnosis not present

## 2019-11-17 DIAGNOSIS — R3911 Hesitancy of micturition: Secondary | ICD-10-CM | POA: Insufficient documentation

## 2019-11-17 DIAGNOSIS — R338 Other retention of urine: Secondary | ICD-10-CM

## 2019-11-17 DIAGNOSIS — N401 Enlarged prostate with lower urinary tract symptoms: Secondary | ICD-10-CM

## 2019-11-17 DIAGNOSIS — Z79899 Other long term (current) drug therapy: Secondary | ICD-10-CM | POA: Insufficient documentation

## 2019-11-17 DIAGNOSIS — R39198 Other difficulties with micturition: Secondary | ICD-10-CM | POA: Insufficient documentation

## 2019-11-17 DIAGNOSIS — F319 Bipolar disorder, unspecified: Secondary | ICD-10-CM | POA: Diagnosis not present

## 2019-11-17 DIAGNOSIS — R350 Frequency of micturition: Secondary | ICD-10-CM | POA: Insufficient documentation

## 2019-11-17 DIAGNOSIS — R339 Retention of urine, unspecified: Secondary | ICD-10-CM

## 2019-11-17 HISTORY — DX: Bipolar disorder, unspecified: F31.9

## 2019-11-17 HISTORY — PX: CYSTOSCOPY WITH INSERTION OF UROLIFT: SHX6678

## 2019-11-17 SURGERY — CYSTOSCOPY WITH INSERTION OF UROLIFT
Anesthesia: General | Site: Prostate

## 2019-11-17 MED ORDER — FENTANYL CITRATE (PF) 100 MCG/2ML IJ SOLN: 25.0000 ug | INTRAMUSCULAR | Status: DC | PRN

## 2019-11-17 MED ORDER — LIDOCAINE HCL 2 % EX GEL
CUTANEOUS | Status: DC | PRN
  Administered 2019-11-17: 1 via URETHRAL

## 2019-11-17 MED ORDER — ONDANSETRON HCL 4 MG/2ML IJ SOLN
INTRAMUSCULAR | Status: DC | PRN
  Administered 2019-11-17: 4 mg via INTRAVENOUS

## 2019-11-17 MED ORDER — PROPOFOL 500 MG/50ML IV EMUL
INTRAVENOUS | Status: DC | PRN
  Administered 2019-11-17: 100 ug/kg/min via INTRAVENOUS

## 2019-11-17 MED ORDER — PROPOFOL 10 MG/ML IV BOLUS
INTRAVENOUS | Status: AC
  Filled 2019-11-17: qty 20

## 2019-11-17 MED ORDER — CHLORHEXIDINE GLUCONATE 0.12 % MT SOLN
OROMUCOSAL | Status: AC
  Administered 2019-11-17: 15 mL via OROMUCOSAL
  Filled 2019-11-17: qty 15

## 2019-11-17 MED ORDER — ORAL CARE MOUTH RINSE: 15.0000 mL | Freq: Once | OROMUCOSAL | Status: AC

## 2019-11-17 MED ORDER — ONDANSETRON HCL 4 MG/2ML IJ SOLN: 4.0000 mg | Freq: Once | INTRAMUSCULAR | Status: DC | PRN

## 2019-11-17 MED ORDER — CHLORHEXIDINE GLUCONATE 0.12 % MT SOLN: 15.0000 mL | Freq: Once | OROMUCOSAL | Status: AC

## 2019-11-17 MED ORDER — PROPOFOL 10 MG/ML IV BOLUS
INTRAVENOUS | Status: DC | PRN
  Administered 2019-11-17: 50 mg via INTRAVENOUS

## 2019-11-17 MED ORDER — CEFAZOLIN SODIUM-DEXTROSE 2-4 GM/100ML-% IV SOLN
INTRAVENOUS | Status: AC
  Filled 2019-11-17: qty 100

## 2019-11-17 MED ORDER — CEFAZOLIN SODIUM-DEXTROSE 2-4 GM/100ML-% IV SOLN
2.0000 g | INTRAVENOUS | Status: AC
  Administered 2019-11-17: 2 g via INTRAVENOUS

## 2019-11-17 MED ORDER — LACTATED RINGERS IV SOLN: INTRAVENOUS | Status: DC

## 2019-11-17 SURGICAL SUPPLY — 16 items
BAG DRAIN CYSTO-URO LG1000N (MISCELLANEOUS) ×3 IMPLANT
BRUSH SCRUB EZ  4% CHG (MISCELLANEOUS) ×2
BRUSH SCRUB EZ 4% CHG (MISCELLANEOUS) ×1 IMPLANT
GLOVE BIOGEL PI IND STRL 7.5 (GLOVE) ×1 IMPLANT
GLOVE BIOGEL PI INDICATOR 7.5 (GLOVE) ×2
GOWN STRL REUS W/ TWL LRG LVL3 (GOWN DISPOSABLE) ×1 IMPLANT
GOWN STRL REUS W/ TWL XL LVL3 (GOWN DISPOSABLE) ×1 IMPLANT
GOWN STRL REUS W/TWL LRG LVL3 (GOWN DISPOSABLE) ×3
GOWN STRL REUS W/TWL XL LVL3 (GOWN DISPOSABLE) ×3
KIT TURNOVER CYSTO (KITS) ×3 IMPLANT
PACK CYSTO AR (MISCELLANEOUS) ×3 IMPLANT
SET CYSTO W/LG BORE CLAMP LF (SET/KITS/TRAYS/PACK) ×3 IMPLANT
SURGILUBE 2OZ TUBE FLIPTOP (MISCELLANEOUS) IMPLANT
SYSTEM UROLIFT (Male Continence) ×21 IMPLANT
WATER STERILE IRR 1000ML POUR (IV SOLUTION) ×3 IMPLANT
WATER STERILE IRR 3000ML UROMA (IV SOLUTION) ×3 IMPLANT

## 2019-11-17 NOTE — Anesthesia Preprocedure Evaluation (Addendum)
Anesthesia Evaluation  Patient identified by MRN, date of birth, ID band Patient awake    Reviewed: Allergy & Precautions, H&P , NPO status , Patient's Chart, lab work & pertinent test results  History of Anesthesia Complications Negative for: history of anesthetic complications  Airway Mallampati: II  TM Distance: >3 FB     Dental   Pulmonary neg pulmonary ROS, neg sleep apnea, neg COPD,    breath sounds clear to auscultation       Cardiovascular hypertension, (-) angina(-) Past MI and (-) Cardiac Stents (-) dysrhythmias  Rhythm:regular Rate:Normal     Neuro/Psych PSYCHIATRIC DISORDERS Bipolar Disorder negative neurological ROS     GI/Hepatic Neg liver ROS, GERD  Controlled,  Endo/Other  negative endocrine ROS  Renal/GU negative Renal ROS  negative genitourinary   Musculoskeletal   Abdominal   Peds  Hematology negative hematology ROS (+)   Anesthesia Other Findings Past Medical History: No date: Bipolar disorder (HCC) No date: GERD (gastroesophageal reflux disease) No date: Hypertension  Past Surgical History: No date: COLONOSCOPY No date: CYST EXCISION     Comment:  BACK OF LEG 2004 AND 07-2019: HERNIA REPAIR     Comment:  [facility]  BMI    Body Mass Index: 23.46 kg/m      Reproductive/Obstetrics negative OB ROS                            Anesthesia Physical Anesthesia Plan  ASA: II  Anesthesia Plan: General   Post-op Pain Management:    Induction:   PONV Risk Score and Plan: Propofol infusion and TIVA  Airway Management Planned: Simple Face Mask  Additional Equipment:   Intra-op Plan:   Post-operative Plan:   Informed Consent: I have reviewed the patients History and Physical, chart, labs and discussed the procedure including the risks, benefits and alternatives for the proposed anesthesia with the patient or authorized  representative who has indicated his/her understanding and acceptance.     Dental Advisory Given  Plan Discussed with: Anesthesiologist, CRNA and Surgeon  Anesthesia Plan Comments:         Anesthesia Quick Evaluation

## 2019-11-17 NOTE — Interval H&P Note (Signed)
History and Physical Interval Note:  CV: RRR Lungs: Clear  11/17/2019 12:32 PM  Zachary Gardner  has presented today for surgery, with the diagnosis of benign prostatic hypertrophy with urinary retention.  The various methods of treatment have been discussed with the patient and family. After consideration of risks, benefits and other options for treatment, the patient has consented to  Procedure(s): CYSTOSCOPY WITH INSERTION OF UROLIFT (N/A) as a surgical intervention.  The patient's history has been reviewed, patient examined, no change in status, stable for surgery.  I have reviewed the patient's chart and labs.  Questions were answered to the patient's satisfaction.     [place]

## 2019-11-17 NOTE — Op Note (Signed)
Preoperative diagnosis:  1. BPH with LUTS 2. Urinary retention secondary to #1 3. Urinary hesitancy secondary to #1 4. Intermittent urinary stream secondary to #1 5. Urinary frequency secondary #1  Postoperative diagnosis: BPH with LUTS  Procedure: Cystoscopy with insertion Urolift (placement of 7 implants)  Surgeon: Bernardo Heater  Anesthesia: MAC  Complications: None  Drains: None  Estimated blood loss: < 5 mL  Indications: 74 y.o. male with who developed urinary retention after a left inguinal hernia repair June 2021.  He failed voiding trials x3 on alpha-blocker therapy and is currently on intermittent catheterization.  He does void between catheterizations however does have urinary hesitancy, decreased stream and urinary frequency.  Office cystoscopy remarkable for moderate lateral lobe enlargement.  Management options including TURP with resection/ablation of the prostate as well as Urolift were discussed.  The patient has chosen to have a Urolift procedure.  He has been instructed to the procedure as well as risks and complications which include but are not limited to infection, bleeding, and inadequate treatment with the Urolift procedure alone, anesthetic complications, among others.  He understands these and desires to proceed.  Findings: Using the 17 French cystoscope, urethra and bladder were inspected.  There were no urethral lesions.  Prostatic urethra was remarkable for moderate lateral lobe enlargement.  The bladder was inspected circumferentially.  This revealed normal findings.  Description of procedure: The patient was properly identified in the holding area.  He received preoperative IV antibiotics.  He was taken to the operating room where deep sedation was obtained by anesthesia.  He was placed in the dorsolithotomy position.  Genitalia and perineum were prepped and draped.  Proper timeout was performed.  A 14F cystoscope was inserted into the bladder. The cystoscopy bridge  was replaced with a UroLift delivery device.  The 1st pair of implants were placed 1.5-2 cm from the bladder neck.  The 2nd pair of implants were placed just proximal to the verumontanum. The delivery device was then replaced with cystoscope and bridge and the implant location and opening effect was confirmed cystoscopically.  An additional pair of implants was placed between the prior pairs.  A repeat cystoscopy was performed and in the mid prostate on the right additional occlusive tissue of the anterior channel was noted.  A 7 implant was placed stacked posterior to the right mid implant.  A final cystoscopy was conducted first to inspect the location and state of each implant and second, to confirm the presence of a continuous anterior channel was present through the prostatic urethra with irrigation flow turned off.  No injury to the bladder or urethra was detected.  7 implants were delivered in total.   He tolerated procedure well and was transported to the PACU in stable condition.   Plan: A 16 French Foley catheter was placed without difficulty.  Follow-up office visit 11/19/2019 for catheter removal/voiding trial   Spurgeon Giovanni, MD

## 2019-11-17 NOTE — Discharge Instructions (Signed)
AMBULATORY SURGERY  DISCHARGE INSTRUCTIONS   1) The drugs that you were given will stay in your system until tomorrow so for the next 24 hours you should not:  A) Drive an automobile B) Make any legal decisions C) Drink any alcoholic beverage   2) You may resume regular meals tomorrow.  Today it is better to start with liquids and gradually work up to solid foods.  You may eat anything you prefer, but it is better to start with liquids, then soup and crackers, and gradually work up to solid foods.   3) Please notify your doctor immediately if you have any unusual bleeding, trouble breathing, redness and pain at the surgery site, drainage, fever, or pain not relieved by medication.    4) Additional Instructions:    Urolift Post-Operative Instructions     Patient Expectations   1. Mild blood in your urine for about 1 week.  2. Urinary buring, frequency, and urgency for 10-14 days.  3. Mild pelvic pain 1-2 weeks.     Return to Activity     1. Drink water post procedure.  2. Take meds as needed.  Tylenol and/or Motrin is most helpful.  You may also by Pyridium/Azo over-the-counter for urinary burning.  Continue taking any prostate medications until your first postoperative visit.    3. No lifting or straining 48hrs.  4. Other activity when they feel up to it.  5.  Call our office [phone] for any questions or concerns.        6.  You will have an appointment 9/23 for catheter removal     Please contact your physician with any problems or Same Day Surgery at [phone], Monday through Friday 6 am to 4 pm, or [place] at [facility] number at [phone].

## 2019-11-17 NOTE — Transfer of Care (Signed)
Immediate Anesthesia Transfer of Care Note  Patient: Zachary Gardner  Procedure(s) Performed: CYSTOSCOPY WITH INSERTION OF UROLIFT (N/A Prostate)  Patient Location: PACU  Anesthesia Type:General  Level of Consciousness: awake  Airway & Oxygen Therapy: Patient Spontanous Breathing  Post-op Assessment: Report given to RN and Post -op Vital signs reviewed and stable  Post vital signs: Reviewed and stable  Last Vitals:  Vitals Value Taken Time  BP 131/73 11/17/19 1338  Temp    Pulse 51 11/17/19 1340  Resp 12 11/17/19 1340  SpO2 100 % 11/17/19 1340  Vitals shown include unvalidated device data.  Last Pain:  Vitals:   11/17/19 1338  TempSrc:   PainSc: (P) Asleep         Complications: No complications documented.

## 2019-11-18 ENCOUNTER — Encounter: Payer: Self-pay | Admitting: Urology

## 2019-11-18 NOTE — Anesthesia Postprocedure Evaluation (Signed)
Anesthesia Post Note  Patient: Zachary Gardner  Procedure(s) Performed: CYSTOSCOPY WITH INSERTION OF UROLIFT (N/A Prostate)  Patient location during evaluation: PACU Anesthesia Type: General Level of consciousness: awake and alert Pain management: pain level controlled Vital Signs Assessment: post-procedure vital signs reviewed and stable Respiratory status: spontaneous breathing, nonlabored ventilation and respiratory function stable Cardiovascular status: blood pressure returned to baseline and stable Postop Assessment: no apparent nausea or vomiting Anesthetic complications: no   No complications documented.   Last Vitals:  Vitals:   11/17/19 1425 11/17/19 1447  BP: (!) 161/69 (!) 172/75  Pulse:  62  Resp: 16 15  Temp: 36.4 C   SpO2: 100% 100%    Last Pain:  Vitals:   11/17/19 1447  TempSrc:   PainSc: [name]

## 2019-11-19 ENCOUNTER — Ambulatory Visit (INDEPENDENT_AMBULATORY_CARE_PROVIDER_SITE_OTHER): Payer: No Typology Code available for payment source | Admitting: Physician Assistant

## 2019-11-19 ENCOUNTER — Other Ambulatory Visit: Payer: Self-pay

## 2019-11-19 ENCOUNTER — Encounter: Payer: Self-pay | Admitting: Physician Assistant

## 2019-11-19 ENCOUNTER — Ambulatory Visit: Payer: No Typology Code available for payment source | Admitting: Physician Assistant

## 2019-11-19 VITALS — BP 144/71 | HR 65 | Ht 68.0 in | Wt 152.4 lb

## 2019-11-19 DIAGNOSIS — N401 Enlarged prostate with lower urinary tract symptoms: Secondary | ICD-10-CM | POA: Diagnosis not present

## 2019-11-19 DIAGNOSIS — R338 Other retention of urine: Secondary | ICD-10-CM | POA: Diagnosis not present

## 2019-11-19 LAB — BLADDER SCAN AMB NON-IMAGING

## 2019-11-19 NOTE — Progress Notes (Signed)
Afternoon follow-up  Patient returned to clinic this afternoon for repeat PVR. He reports drinking approximately 48oz of fluid. He has been able to urinate. He has had urinary leakage. PVR 84mL.  Results for orders placed or performed in visit on 11/19/19  BLADDER SCAN AMB NON-IMAGING  Result Value Ref Range   Scan Result 80mL    Voiding trial passed. Counseled patient on normal postoperative symptoms including dysuria, gross hematuria, and leakage. Patient to continue silodosin and finasteride for now, will revisit at postop follow-up with Dr. Bernardo Heater.  Follow up: Return for as scheduled.

## 2019-11-19 NOTE — Progress Notes (Signed)
Catheter Removal  Patient is present today for a catheter removal.  83ml of water was drained from the balloon. A 16FR foley cath was removed from the bladder no complications were noted . Patient tolerated well.  Performed by: Debroah Loop, PA-C   Follow up/ Additional notes: Push fluids and RTC this afternoon for PVR.

## 2019-12-04 ENCOUNTER — Ambulatory Visit: Payer: No Typology Code available for payment source | Admitting: Urology

## 2019-12-23 NOTE — Progress Notes (Signed)
12/24/2019 12:58 PM   Zachary Gardner [birth year] [id number]  Referring provider: Keane Police, MD [address] [place],  [place] [address] Chief Complaint  Patient presents with   Benign Prostatic Hypertrophy    HPI: Zachary Gardner is a 74 y.o. male who returns 5 weeks post op Urolift on 11/17/2019.    -Patient was seen on 08/26/2019 for acute urinary retention after left inguinal hernia repair.  -Catheter removed 9/23 and follow-up PVR bladder scan 68 mL -Voiding well with good urinary stream. -IPSS 10/35 -He had mild dysuria and frequency 2 weeks after Urolift.  -Denies any bothersome urinary symptoms.  -Bladder scan by PVR is 93 mL.    IPSS    Row Name 12/24/19 1200         International Prostate Symptom Score   How often have you had the sensation of not emptying your bladder? Not at All     How often have you had to urinate less than every two hours? About half the time     How often have you found you stopped and started again several times when you urinated? Less than 1 in 5 times     How often have you found it difficult to postpone urination? About half the time     How often have you had a weak urinary stream? Not at All     How often have you had to strain to start urination? Not at All     How many times did you typically get up at night to urinate? 3 Times     Total IPSS Score 10       Quality of Life due to urinary symptoms   If you were to spend the rest of your life with your urinary condition just the way it is now how would you feel about that? Pleased            Score:  1-7 Mild 8-19 Moderate 20-35 Severe   PMH: Past Medical History:  Diagnosis Date   Bipolar disorder ([place])    GERD (gastroesophageal reflux disease)    Hypertension     Surgical History: Past Surgical History:  Procedure Laterality Date   COLONOSCOPY     CYST EXCISION     BACK OF LEG   CYSTOSCOPY WITH INSERTION OF UROLIFT N/A 11/17/2019   Procedure: CYSTOSCOPY  WITH INSERTION OF UROLIFT;  Surgeon: Abbie Sons, MD;  Location: [facility] ORS;  Service: Urology;  Laterality: N/A;   HERNIA REPAIR  2004 AND 07-2019   [facility]    Home Medications:  Allergies as of 12/24/2019      Reactions   Lactose Intolerance (gi)       Medication List       Accurate as of December 24, 2019 12:58 PM. If you have any questions, ask your nurse or doctor.        cholecalciferol 25 MCG (1000 UNIT) tablet Commonly known as: VITAMIN D Take 1,000 Units by mouth daily.   finasteride 5 MG tablet Commonly known as: PROSCAR Take 5 mg by mouth at bedtime.   lisinopril 10 MG tablet Commonly known as: ZESTRIL Take 10 mg by mouth at bedtime.   lithium 300 MG tablet Take 300 mg by mouth at bedtime.   loperamide 2 MG capsule Commonly known as: IMODIUM Take 2 mg by mouth in the morning and at bedtime. TAKES THIS FOR HIS LACTOSE INTOLERANCE   naproxen  sodium 220 MG tablet Commonly known as: ALEVE Take 220-440 mg by mouth 2 (two) times daily as needed (pain.).   Omeprazole 20 MG Tbec Take 20 mg by mouth in the morning and at bedtime.   silodosin 8 MG Caps capsule Commonly known as: RAPAFLO Take 1 capsule (8 mg total) by mouth daily with breakfast. What changed: when to take this   simvastatin 40 MG tablet Commonly known as: ZOCOR Take 40 mg by mouth at bedtime.   vitamin B-12 1000 MCG tablet Commonly known as: CYANOCOBALAMIN Take 1,000 mcg by mouth daily.       Allergies:  Allergies  Allergen Reactions   Lactose Intolerance (Gi)     Family History: No family history on file.  Social History:  reports that he has never smoked. He has never used smokeless tobacco. He reports that he does not drink alcohol and does not use drugs.   Physical Exam: BP 130/80    Pulse 74    Ht 5\' 5"  (1.651 m)    Wt 152 lb (68.9 kg)    BMI 25.29 kg/m   Constitutional:  Alert and oriented, No acute distress. HEENT: [place] AT, moist  mucus membranes.  Trachea midline, no masses. Cardiovascular: No clubbing, cyanosis, or edema. Respiratory: Normal respiratory effort, no increased work of breathing. Skin: No rashes, bruises or suspicious lesions. Neurologic: Grossly intact, no focal deficits, moving all 4 extremities. Psychiatric: Normal mood and affect.  Laboratory Data:  Pertinent Imaging: Results for orders placed or performed in visit on 12/24/19  Bladder Scan (Post Void Residual) in office  Result Value Ref Range   Scan Result 93      Assessment & Plan:    1. BPH with lower urinary tract symptoms Urinary retention resolved s/p Urolift 10/2019. IPSS score: 10, moderate Mild PVR at 93 mL.  Discontinue alpha-blocker Continue Finasteride.   Follow in 6 months with IPSS/PVR   [place] [address], [place], [place] [address] [phone]  I, Selena Batten, am acting as a scribe for Dr. Nicki Reaper C. Andriy Sherk,  I have reviewed the above documentation for accuracy and completeness, and I agree with the above.   Abbie Sons, MD

## 2019-12-24 ENCOUNTER — Other Ambulatory Visit: Payer: Self-pay

## 2019-12-24 ENCOUNTER — Encounter: Payer: Self-pay | Admitting: Urology

## 2019-12-24 ENCOUNTER — Ambulatory Visit (INDEPENDENT_AMBULATORY_CARE_PROVIDER_SITE_OTHER): Payer: No Typology Code available for payment source | Admitting: Urology

## 2019-12-24 VITALS — BP 130/80 | HR 74 | Ht 65.0 in | Wt 152.0 lb

## 2019-12-24 DIAGNOSIS — R338 Other retention of urine: Secondary | ICD-10-CM | POA: Diagnosis not present

## 2019-12-24 DIAGNOSIS — N401 Enlarged prostate with lower urinary tract symptoms: Secondary | ICD-10-CM | POA: Diagnosis not present

## 2019-12-24 LAB — BLADDER SCAN AMB NON-IMAGING: Scan Result: 93

## 2020-03-07 ENCOUNTER — Encounter: Payer: Self-pay | Admitting: Dermatology

## 2020-03-07 ENCOUNTER — Other Ambulatory Visit: Payer: Self-pay

## 2020-03-07 ENCOUNTER — Ambulatory Visit (INDEPENDENT_AMBULATORY_CARE_PROVIDER_SITE_OTHER): Payer: Medicare HMO | Admitting: Dermatology

## 2020-03-07 DIAGNOSIS — L814 Other melanin hyperpigmentation: Secondary | ICD-10-CM

## 2020-03-07 DIAGNOSIS — D229 Melanocytic nevi, unspecified: Secondary | ICD-10-CM

## 2020-03-07 DIAGNOSIS — L300 Nummular dermatitis: Secondary | ICD-10-CM

## 2020-03-07 DIAGNOSIS — Z86018 Personal history of other benign neoplasm: Secondary | ICD-10-CM | POA: Diagnosis not present

## 2020-03-07 DIAGNOSIS — B351 Tinea unguium: Secondary | ICD-10-CM

## 2020-03-07 DIAGNOSIS — L821 Other seborrheic keratosis: Secondary | ICD-10-CM

## 2020-03-07 DIAGNOSIS — Z1283 Encounter for screening for malignant neoplasm of skin: Secondary | ICD-10-CM | POA: Diagnosis not present

## 2020-03-07 DIAGNOSIS — L570 Actinic keratosis: Secondary | ICD-10-CM

## 2020-03-07 DIAGNOSIS — L219 Seborrheic dermatitis, unspecified: Secondary | ICD-10-CM | POA: Diagnosis not present

## 2020-03-07 DIAGNOSIS — L578 Other skin changes due to chronic exposure to nonionizing radiation: Secondary | ICD-10-CM

## 2020-03-07 MED ORDER — TRIAMCINOLONE ACETONIDE 0.1 % EX CREA: 1.0000 "application " | TOPICAL_CREAM | CUTANEOUS | 1 refills | Status: DC

## 2020-03-07 MED ORDER — KETOCONAZOLE 2 % EX SHAM: 1.0000 "application " | MEDICATED_SHAMPOO | Freq: Every day | CUTANEOUS | 3 refills | Status: DC

## 2020-03-07 NOTE — Progress Notes (Deleted)
   Follow-Up Visit   Subjective  Zachary Gardner is a 75 y.o. male who presents for the following: Nevus and Annual Exam (Upper body skin exam, hx of AKs).  ***  The following portions of the chart were reviewed this encounter and updated as appropriate:      {Review of Systems:34166::"No other skin or systemic complaints."}  Objective  Well appearing patient in no apparent distress; mood and affect are within normal limits.  {ZCHY:85027::"X full examination was performed including scalp, head, eyes, ears, nose, lips, neck, chest, axillae, abdomen, back, buttocks, bilateral upper extremities, bilateral lower extremities, hands, feet, fingers, toes, fingernails, and toenails. All findings within normal limits unless otherwise noted below."}   Assessment & Plan   Lentigines - Scattered tan macules - Discussed due to sun exposure - Benign, observe - Call for any changes  Seborrheic Keratoses - Stuck-on, waxy, tan-brown papules and plaques  - Discussed benign etiology and prognosis. - Observe - Call for any changes  Melanocytic Nevi - Tan-brown and/or pink-flesh-colored symmetric macules and papules - Benign appearing on exam today - Observation - Call clinic for new or changing moles - Recommend daily use of broad spectrum spf 30+ sunscreen to sun-exposed areas.   Hemangiomas - Red papules - Discussed benign nature - Observe - Call for any changes  Actinic Damage - Chronic, secondary to cumulative UV/sun exposure - diffuse scaly erythematous macules with underlying dyspigmentation - Recommend daily broad spectrum sunscreen SPF 30+ to sun-exposed areas, reapply every 2 hours as needed.  - Call for new or changing lesions. 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  - Call if any new or changing lesions are  noted between office visits  Skin cancer screening performed today.  No follow-ups on file.

## 2020-03-07 NOTE — Progress Notes (Deleted)
   Follow-Up Visit   Subjective  Zachary Gardner is a 75 y.o. male who presents for the following: TBSE.  ***  The following portions of the chart were reviewed this encounter and updated as appropriate:      {Review of Systems:34166::"No other skin or systemic complaints."}  Objective  Well appearing patient in no apparent distress; mood and affect are within normal limits.  {HYWV:37106::"Y full examination was performed including scalp, head, eyes, ears, nose, lips, neck, chest, axillae, abdomen, back, buttocks, bilateral upper extremities, bilateral lower extremities, hands, feet, fingers, toes, fingernails, and toenails. All findings within normal limits unless otherwise noted below."}   Assessment & Plan   Lentigines - Scattered tan macules - Discussed due to sun exposure - Benign, observe - Call for any changes  Seborrheic Keratoses - Stuck-on, waxy, tan-brown papules and plaques  - Discussed benign etiology and prognosis. - Observe - Call for any changes  Melanocytic Nevi - Tan-brown and/or pink-flesh-colored symmetric macules and papules - Benign appearing on exam today - Observation - Call clinic for new or changing moles - Recommend daily use of broad spectrum spf 30+ sunscreen to sun-exposed areas.   Hemangiomas - Red papules - Discussed benign nature - Observe - Call for any changes  Actinic Damage - Chronic, secondary to cumulative UV/sun exposure - diffuse scaly erythematous macules with underlying dyspigmentation - Recommend daily broad spectrum sunscreen SPF 30+ to sun-exposed areas, reapply every 2 hours as needed.  - Call for new or changing lesions. 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  - Call if any new or changing lesions are noted between office visits  Skin cancer  screening performed today.  No follow-ups on file.

## 2020-03-07 NOTE — Patient Instructions (Signed)
Terbinafine Counseling ° °Terbinafine is an anti-fungal medicine that can be applied to the skin (over the counter) or taken by mouth (prescription) to treat fungal infections. The pill version is often used to treat fungal infections of the nails or scalp. While most people do not have any side effects from taking terbinafine pills, some possible side effects of the medicine can include taste changes, headache, loss of smell, vision changes, nausea, vomiting, or diarrhea.  ° °Rare side effects can include irritation of the liver, allergic reaction, or decrease in blood counts (which may show up as not feeling well or developing an infection). If you are concerned about any of these side effects, please stop the medicine and call your doctor, or in the case of an emergency such as feeling very unwell, seek immediate medical care.  ° °

## 2020-03-07 NOTE — Progress Notes (Signed)
Follow-Up Visit   Subjective  Zachary Gardner is a 75 y.o. male who presents for the following: Nevus, Annual Exam (Upper body skin exam, hx of Aks, Dysplastic L abdomen), check toenail (R great toenail, dark, used otc treatment), dandruff (Scalp, Tsal or Tgel shampoo), and dry skin with pruritis (Bil arms, using cerave cream).   The following portions of the chart were reviewed this encounter and updated as appropriate:       Review of Systems:  No other skin or systemic complaints except as noted in HPI or Assessment and Plan.  Objective  Well appearing patient in no apparent distress; mood and affect are within normal limits.  A focused examination was performed including all skin from waist up and toenails. Relevant physical exam findings are noted in the Assessment and Plan.  Objective  L abdomen: Scar with no evidence of recurrence.   Objective  Scalp: Mild scaling scalp, pt c/o itching  Objective  crown scalp x 5 (5): Pink/brown scaly papules   Objective  bil forearms: Mild lichenification bil forearms near elbow  Objective  R great toenail, L great toenail: Thickening with sub ungual debris yellow/brown discoloration R great toenail ~60%, distal involvement L great toenail  Images       Assessment & Plan    Seborrheic Keratoses - Stuck-on, waxy, tan-brown papules and plaques  - Discussed benign etiology and prognosis. - Observe - Call for any changes  Actinic Damage - chronic, secondary to cumulative UV radiation exposure/sun exposure over time - diffuse scaly erythematous macules with underlying dyspigmentation - Recommend daily broad spectrum sunscreen SPF 30+ to sun-exposed areas, reapply every 2 hours as needed.  - Call for new or changing lesions.  Melanocytic Nevi - Tan-brown and/or pink-flesh-colored symmetric macules and papules - Benign appearing on exam today - Observation - Call clinic for new or changing moles - Recommend daily  use of broad spectrum spf 30+ sunscreen to sun-exposed areas.   Lentigines - Scattered tan macules - Discussed due to sun exposure - Benign, observe - Recommend daily broad spectrum sunscreen SPF 30+ to sun-exposed areas, reapply every 2 hours as needed. - Call for any changes  Skin cancer screening performed today.   History of dysplastic nevus L abdomen  Clear. Observe for recurrence. Call clinic for new or changing lesions.  Recommend regular skin exams, daily broad-spectrum spf 30+ sunscreen use, and photoprotection.     Seborrheic dermatitis Scalp  Start Ketoconazole 2% shampoo qd, let sit 5 minutes and rinse out  ketoconazole (NIZORAL) 2 % shampoo - Scalp  AK (actinic keratosis) (5) crown scalp x 5  Hypertrophic Aks Vrs ISKs  Destruction of lesion - crown scalp x 5  Destruction method: cryotherapy   Informed consent: discussed and consent obtained   Lesion destroyed using liquid nitrogen: Yes   Region frozen until ice ball extended beyond lesion: Yes   Outcome: patient tolerated procedure well with no complications   Post-procedure details: wound care instructions given    Nummular dermatitis bil forearms  Start TMC 0.1% cr qd/bid up to 5d/wk aa arms until clear, then prn flares, avoid f/g/a Recommend mild soap Dove for sensitive skin and cerave anti-itch cream qd   Topical steroids (such as triamcinolone, fluocinolone, fluocinonide, mometasone, clobetasol, halobetasol, betamethasone, hydrocortisone) can cause thinning and lightening of the skin if they are used for too long in the same area. Your physician has selected the right strength medicine for your problem and area affected on the body.  Please use your medication only as directed by your physician to prevent side effects.    triamcinolone (KENALOG) 0.1 % - bil forearms  Onychomycosis R great toenail, L great toenail  Pending labs start Lamisil 250mg  1 po qd  Terbinafine Counseling  Terbinafine is  an anti-fungal medicine that can be applied to the skin (over the counter) or taken by mouth (prescription) to treat fungal infections. The pill version is often used to treat fungal infections of the nails or scalp. While most people do not have any side effects from taking terbinafine pills, some possible side effects of the medicine can include taste changes, headache, loss of smell, vision changes, nausea, vomiting, or diarrhea.   Rare side effects can include irritation of the liver, allergic reaction, or decrease in blood counts (which may show up as not feeling well or developing an infection). If you are concerned about any of these side effects, please stop the medicine and call your doctor, or in the case of an emergency such as feeling very unwell, seek immediate medical care.    Hepatic Function Panel - R great toenail, L great toenail  Return in about 1 month (around 04/07/2020) for onychomycosis.   I, Othelia Pulling, RMA, am acting as scribe for Brendolyn Patty, MD . Documentation: I have reviewed the above documentation for accuracy and completeness, and I agree with the above.  Brendolyn Patty MD

## 2020-03-08 ENCOUNTER — Telehealth: Payer: Self-pay

## 2020-03-08 LAB — HEPATIC FUNCTION PANEL
ALT: 7 IU/L (ref 0–44)
AST: 9 IU/L (ref 0–40)
Albumin: 4.5 g/dL (ref 3.7–4.7)
Alkaline Phosphatase: 80 IU/L (ref 44–121)
Bilirubin Total: 0.5 mg/dL (ref 0.0–1.2)
Bilirubin, Direct: 0.16 mg/dL (ref 0.00–0.40)
Total Protein: 7 g/dL (ref 6.0–8.5)

## 2020-03-08 MED ORDER — TERBINAFINE HCL 250 MG PO TABS: 250.0000 mg | ORAL_TABLET | Freq: Every day | ORAL | 0 refills | Status: DC

## 2020-03-08 NOTE — Telephone Encounter (Signed)
-----   Message from Brendolyn Patty, MD sent at 03/08/2020  8:17 AM EST ----- Baseline LFTs ok, can send in Lamisil 250 mg PO qd to [facility], #30, no rfs.

## 2020-03-08 NOTE — Telephone Encounter (Signed)
Advised pt of labs.  Advised Lamisil 250mg  1 po qd #30 0rf to [facility] on [address].  Advised pt to keep his f/u appt./sh

## 2020-04-04 ENCOUNTER — Other Ambulatory Visit: Payer: No Typology Code available for payment source

## 2020-04-04 ENCOUNTER — Other Ambulatory Visit: Payer: Self-pay | Admitting: *Deleted

## 2020-04-04 DIAGNOSIS — Z20822 Contact with and (suspected) exposure to covid-19: Secondary | ICD-10-CM

## 2020-04-06 LAB — SARS-COV-2, NAA 2 DAY TAT

## 2020-04-06 LAB — NOVEL CORONAVIRUS, NAA: SARS-CoV-2, NAA: DETECTED — AB

## 2020-04-07 ENCOUNTER — Telehealth: Payer: Self-pay

## 2020-04-07 NOTE — Telephone Encounter (Signed)
Called to discuss with patient about COVID-19 symptoms and the use of one of the available treatments for those with mild to moderate Covid symptoms and at a high risk of hospitalization.  Pt appears to qualify for outpatient treatment due to co-morbid conditions and/or a member of an at-risk group in accordance with the FDA Emergency Use Authorization.    Symptom onset: 04/01/20 Vaccinated: Yes Booster? No Immunocompromised? No Qualifiers:    Pt. Would like to speak with APP.   Zachary Gardner

## 2020-04-11 ENCOUNTER — Ambulatory Visit: Payer: No Typology Code available for payment source | Admitting: Dermatology

## 2020-04-16 ENCOUNTER — Emergency Department: Payer: No Typology Code available for payment source

## 2020-04-16 ENCOUNTER — Other Ambulatory Visit: Payer: Self-pay

## 2020-04-16 ENCOUNTER — Emergency Department
Admission: EM | Admit: 2020-04-16 | Discharge: 2020-04-16 | Disposition: A | Discharge status: Home / Self Care | Payer: No Typology Code available for payment source | Attending: Emergency Medicine | Admitting: Emergency Medicine

## 2020-04-16 DIAGNOSIS — I10 Essential (primary) hypertension: Secondary | ICD-10-CM | POA: Insufficient documentation

## 2020-04-16 DIAGNOSIS — U099 Post covid-19 condition, unspecified: Secondary | ICD-10-CM | POA: Diagnosis not present

## 2020-04-16 DIAGNOSIS — R5383 Other fatigue: Secondary | ICD-10-CM | POA: Diagnosis present

## 2020-04-16 DIAGNOSIS — B948 Sequelae of other specified infectious and parasitic diseases: Secondary | ICD-10-CM

## 2020-04-16 DIAGNOSIS — Z79899 Other long term (current) drug therapy: Secondary | ICD-10-CM | POA: Diagnosis not present

## 2020-04-16 LAB — COMPREHENSIVE METABOLIC PANEL
ALT: 46 U/L — ABNORMAL HIGH (ref 0–44)
AST: 22 U/L (ref 15–41)
Albumin: 4.6 g/dL (ref 3.5–5.0)
Alkaline Phosphatase: 75 U/L (ref 38–126)
Anion gap: 12 (ref 5–15)
BUN: 53 mg/dL — ABNORMAL HIGH (ref 8–23)
CO2: 23 mmol/L (ref 22–32)
Calcium: 10.5 mg/dL — ABNORMAL HIGH (ref 8.9–10.3)
Chloride: 107 mmol/L (ref 98–111)
Creatinine, Ser: 1.47 mg/dL — ABNORMAL HIGH (ref 0.61–1.24)
GFR, Estimated: 49 mL/min — ABNORMAL LOW (ref >60)
Glucose, Bld: 157 mg/dL — ABNORMAL HIGH (ref 70–99)
Potassium: 4.3 mmol/L (ref 3.5–5.1)
Sodium: 142 mmol/L (ref 135–145)
Total Bilirubin: 1.5 mg/dL — ABNORMAL HIGH (ref 0.3–1.2)
Total Protein: 7.9 g/dL (ref 6.5–8.1)

## 2020-04-16 LAB — CBC
HCT: 50.5 % (ref 39.0–52.0)
Hemoglobin: 16.3 g/dL (ref 13.0–17.0)
MCH: 29.5 pg (ref 26.0–34.0)
MCHC: 32.3 g/dL (ref 30.0–36.0)
MCV: 91.3 fL (ref 80.0–100.0)
Platelets: 212 10*3/uL (ref 150–400)
RBC: 5.53 MIL/uL (ref 4.22–5.81)
RDW: 12.2 % (ref 11.5–15.5)
WBC: 10.3 10*3/uL (ref 4.0–10.5)
nRBC: 0 % (ref 0.0–0.2)

## 2020-04-16 MED ORDER — SODIUM CHLORIDE 0.9 % IV SOLN
1000.0000 mL | Freq: Once | INTRAVENOUS | Status: AC
  Administered 2020-04-16: 1000 mL via INTRAVENOUS

## 2020-04-16 NOTE — ED Provider Notes (Signed)
[facility] Emergency Department Provider Note   ____________________________________________    I have reviewed the triage vital signs and the nursing notes.   HISTORY  Chief Complaint Fatigue     HPI Zachary Gardner is a 75 y.o. male with history as noted below who presents with complaints of fatigue, decreased appetite.  Patient reports he had COVID-19 3 weeks ago and has "not bounced back".  He reports decreased appetite and states nothing tastes good.  Constantly feels fatigued.  No shortness of breath, occasional cough.  His wife had the same time he did.  He reports he did have vaccines but did not get a booster.  He states decreased p.o. intake  Past Medical History:  Diagnosis Date  . Bipolar disorder ([place])   . Dysplastic nevus 03/30/2019   Left abdomen, moderate to severa atypia, excision   . GERD (gastroesophageal reflux disease)   . Hypertension     There are no problems to display for this patient.   Past Surgical History:  Procedure Laterality Date  . COLONOSCOPY    . CYST EXCISION     BACK OF LEG  . CYSTOSCOPY WITH INSERTION OF UROLIFT N/A 11/17/2019   Procedure: CYSTOSCOPY WITH INSERTION OF UROLIFT;  Surgeon: Abbie Sons, MD;  Location: [facility] ORS;  Service: Urology;  Laterality: N/A;  . HERNIA REPAIR  2004 AND 07-2019   [facility]    Prior to Admission medications   Medication Sig Start Date End Date Taking? Authorizing Provider  cholecalciferol (VITAMIN D) 25 MCG (1000 UNIT) tablet Take 1,000 Units by mouth daily.    [provider]  finasteride (PROSCAR) 5 MG tablet Take 5 mg by mouth at bedtime.  03/11/19   [provider]  ketoconazole (NIZORAL) 2 % shampoo Apply 1 application topically daily. Wash scalp, let sit 5 minutes and rinse out 03/07/20   Brendolyn Patty, MD  lisinopril (ZESTRIL) 10 MG tablet Take 10 mg by mouth at bedtime.    [provider]  lithium 300 MG  tablet Take 300 mg by mouth at bedtime.     [provider]  loperamide (IMODIUM) 2 MG capsule Take 2 mg by mouth in the morning and at bedtime. TAKES THIS FOR HIS LACTOSE INTOLERANCE    [provider]  naproxen sodium (ALEVE) 220 MG tablet Take 220-440 mg by mouth 2 (two) times daily as needed (pain.).    [provider]  Omeprazole 20 MG TBEC Take 20 mg by mouth in the morning and at bedtime.  03/11/19   [provider]  silodosin (RAPAFLO) 8 MG CAPS capsule Take 1 capsule (8 mg total) by mouth daily with breakfast. Patient taking differently: Take 8 mg by mouth at bedtime.  08/27/19   Stoioff, Ronda Fairly, MD  simvastatin (ZOCOR) 40 MG tablet Take 40 mg by mouth at bedtime.     [provider]  terbinafine (LAMISIL) 250 MG tablet Take 1 tablet (250 mg total) by mouth daily. 03/08/20   Brendolyn Patty, MD  triamcinolone (KENALOG) 0.1 % Apply 1 application topically as directed. Qd to bid up to 5 days per week aa arms until clear, then prn flares, avoid face, groin 03/07/20   Brendolyn Patty, MD  vitamin B-12 (CYANOCOBALAMIN) 1000 MCG tablet Take 1,000 mcg by mouth daily.    [provider]     Allergies Lactose intolerance (gi)  History reviewed. No pertinent family history.  Social  History Social History   Tobacco Use  . Smoking status: Never Smoker  . Smokeless tobacco: Never Used  Vaping Use  . Vaping Use: Never used  Substance Use Topics  . Alcohol use: Never  . Drug use: Never    Review of Systems  Constitutional: Fatigue Eyes: No visual changes.  ENT: Decreased p.o. intake Cardiovascular: Denies chest pain. Respiratory: Denies shortness of breath. Gastrointestinal: No abdominal pain.   Genitourinary: Negative for dysuria. Musculoskeletal: Negative for back pain. Skin: Negative for rash. Neurological: Negative for headaches    ____________________________________________   PHYSICAL EXAM:  VITAL SIGNS: ED Triage Vitals   Enc Vitals Group     BP 04/16/20 1218 (!) 142/104     Pulse Rate 04/16/20 1218 93     Resp 04/16/20 1218 18     Temp 04/16/20 1230 98 F (36.7 C)     Temp Source 04/16/20 1230 Oral     SpO2 04/16/20 1230 99 %     Weight --      Height --      Head Circumference --      Peak Flow --      Pain Score 04/16/20 1326 0     Pain Loc --      Pain Edu? --      Excl. in [place]? --     Constitutional: Alert and oriented.   Nose: No congestion/rhinnorhea. Mouth/Throat: Mucous membranes are moist.   Neck:  Painless ROM Cardiovascular: Normal rate, regular rhythm. Grossly normal heart sounds.  Good peripheral circulation. Respiratory: Normal respiratory effort.  No retractions. Lungs CTAB. Gastrointestinal: Soft and nontender. No distention.  No CVA tenderness.  Musculoskeletal: No lower extremity tenderness nor edema.  Warm and well perfused Neurologic:  Normal speech and language. No gross focal neurologic deficits are appreciated.  Skin:  Skin is warm, dry and intact. No rash noted. Psychiatric: Mood and affect are normal. Speech and behavior are normal.  ____________________________________________   LABS (all labs ordered are listed, but only abnormal results are displayed)  Labs Reviewed  COMPREHENSIVE METABOLIC PANEL - Abnormal; Notable for the following components:      Result Value   Glucose, Bld 157 (*)    BUN 53 (*)    Creatinine, Ser 1.47 (*)    Calcium 10.5 (*)    ALT 46 (*)    Total Bilirubin 1.5 (*)    GFR, Estimated 49 (*)    All other components within normal limits  CBC  CBG MONITORING, ED   ____________________________________________  EKG  ED ECG REPORT I, Lavonia Drafts, the attending physician, personally viewed and interpreted this ECG.  Date: 04/16/2020  Rhythm: normal sinus rhythm QRS Axis: normal Intervals: Right bundle-branch block ST/T Wave abnormalities: normal Narrative Interpretation: no evidence of acute  ischemia  ____________________________________________  RADIOLOGY  Chest x-ray reviewed by me ____________________________________________   PROCEDURES  Procedure(s) performed: No  Procedures   Critical Care performed: No ____________________________________________   INITIAL IMPRESSION / ASSESSMENT AND PLAN / ED COURSE  Pertinent labs & imaging results that were available during my care of the patient were reviewed by me and considered in my medical decision making (see chart for details).  Patient presents with fatigue, decreased p.o. intake since having COVID-19.  Symptoms are likely caused by an related to COVID-19, he may still be recovering from the acute phase or he may be in the early stages of the long Covid.  Vital signs are overall reassuring, exam is unremarkable, will obtain  labs give IV fluids check chest x-ray and reevaluate.  Discussed with him that recovery from Covid can take quite some time and he understands that.  Lab work demonstrates dehydration, patient did receive a liter of IV fluids, no indication for admission at this time, outpatient follow-up with his PCP    ____________________________________________   FINAL CLINICAL IMPRESSION(S) / ED DIAGNOSES  Final diagnoses:  Persistent fatigue after COVID-19        Note:  This document was prepared using Dragon voice recognition software and may include unintentional dictation errors.   Lavonia Drafts, MD 04/16/20 [phone]

## 2020-04-16 NOTE — ED Triage Notes (Signed)
Pt states he had COVID 3 weeks ago and is still not feeling well. States has loss of appetite and isn't eating. States has lost weight. A&O, ambulatory. Denied wanting a wheelchair.   Friend who brought pt: Lovie Macadamia [phone]

## 2020-04-16 NOTE — ED Notes (Signed)
Pt states feeling weak and fatigued since covid dx 3 weeks ago. Nausea and diarrhea: 10 times per day past few days. Constant chest pain L chest, on and off. No CP at this time. Denies cough.

## 2020-04-16 NOTE — ED Notes (Signed)
First encounter with pt to D/C. NAD noted. Pt verbalized understanding.

## 2020-04-20 ENCOUNTER — Other Ambulatory Visit: Payer: Self-pay

## 2020-04-20 ENCOUNTER — Encounter: Payer: Self-pay | Admitting: Emergency Medicine

## 2020-04-20 ENCOUNTER — Inpatient Hospital Stay
Admission: EM | Admit: 2020-04-20 | Discharge: 2020-04-26 | DRG: 682 | Disposition: A | Discharge status: Home Health | Payer: No Typology Code available for payment source | Attending: Internal Medicine | Admitting: Internal Medicine

## 2020-04-20 DIAGNOSIS — F32A Depression, unspecified: Secondary | ICD-10-CM | POA: Diagnosis present

## 2020-04-20 DIAGNOSIS — F319 Bipolar disorder, unspecified: Secondary | ICD-10-CM | POA: Diagnosis present

## 2020-04-20 DIAGNOSIS — R1310 Dysphagia, unspecified: Secondary | ICD-10-CM | POA: Diagnosis present

## 2020-04-20 DIAGNOSIS — E860 Dehydration: Secondary | ICD-10-CM | POA: Diagnosis present

## 2020-04-20 DIAGNOSIS — T56891A Toxic effect of other metals, accidental (unintentional), initial encounter: Secondary | ICD-10-CM

## 2020-04-20 DIAGNOSIS — T43591A Poisoning by other antipsychotics and neuroleptics, accidental (unintentional), initial encounter: Secondary | ICD-10-CM | POA: Diagnosis present

## 2020-04-20 DIAGNOSIS — I4510 Unspecified right bundle-branch block: Secondary | ICD-10-CM | POA: Diagnosis present

## 2020-04-20 DIAGNOSIS — K529 Noninfective gastroenteritis and colitis, unspecified: Secondary | ICD-10-CM | POA: Diagnosis not present

## 2020-04-20 DIAGNOSIS — N179 Acute kidney failure, unspecified: Principal | ICD-10-CM | POA: Diagnosis present

## 2020-04-20 DIAGNOSIS — Z515 Encounter for palliative care: Secondary | ICD-10-CM

## 2020-04-20 DIAGNOSIS — R45851 Suicidal ideations: Secondary | ICD-10-CM | POA: Diagnosis present

## 2020-04-20 DIAGNOSIS — Z79899 Other long term (current) drug therapy: Secondary | ICD-10-CM

## 2020-04-20 DIAGNOSIS — K219 Gastro-esophageal reflux disease without esophagitis: Secondary | ICD-10-CM | POA: Diagnosis present

## 2020-04-20 DIAGNOSIS — Z8616 Personal history of COVID-19: Secondary | ICD-10-CM

## 2020-04-20 DIAGNOSIS — I951 Orthostatic hypotension: Secondary | ICD-10-CM | POA: Diagnosis present

## 2020-04-20 DIAGNOSIS — I10 Essential (primary) hypertension: Secondary | ICD-10-CM | POA: Diagnosis present

## 2020-04-20 DIAGNOSIS — U071 COVID-19: Secondary | ICD-10-CM | POA: Diagnosis present

## 2020-04-20 DIAGNOSIS — E739 Lactose intolerance, unspecified: Secondary | ICD-10-CM | POA: Diagnosis present

## 2020-04-20 DIAGNOSIS — R112 Nausea with vomiting, unspecified: Secondary | ICD-10-CM

## 2020-04-20 DIAGNOSIS — R531 Weakness: Secondary | ICD-10-CM

## 2020-04-20 DIAGNOSIS — E43 Unspecified severe protein-calorie malnutrition: Secondary | ICD-10-CM | POA: Diagnosis present

## 2020-04-20 DIAGNOSIS — R627 Adult failure to thrive: Secondary | ICD-10-CM | POA: Diagnosis present

## 2020-04-20 LAB — COMPREHENSIVE METABOLIC PANEL
ALT: 17 U/L (ref 0–44)
AST: 17 U/L (ref 15–41)
Albumin: 4.5 g/dL (ref 3.5–5.0)
Alkaline Phosphatase: 85 U/L (ref 38–126)
Anion gap: 9 (ref 5–15)
BUN: 60 mg/dL — ABNORMAL HIGH (ref 8–23)
CO2: 22 mmol/L (ref 22–32)
Calcium: 11.1 mg/dL — ABNORMAL HIGH (ref 8.9–10.3)
Chloride: 111 mmol/L (ref 98–111)
Creatinine, Ser: 1.64 mg/dL — ABNORMAL HIGH (ref 0.61–1.24)
GFR, Estimated: 43 mL/min — ABNORMAL LOW (ref >60)
Glucose, Bld: 134 mg/dL — ABNORMAL HIGH (ref 70–99)
Potassium: 4.2 mmol/L (ref 3.5–5.1)
Sodium: 142 mmol/L (ref 135–145)
Total Bilirubin: 1.3 mg/dL — ABNORMAL HIGH (ref 0.3–1.2)
Total Protein: 7.7 g/dL (ref 6.5–8.1)

## 2020-04-20 LAB — CBC
HCT: 52.4 % — ABNORMAL HIGH (ref 39.0–52.0)
Hemoglobin: 16.6 g/dL (ref 13.0–17.0)
MCH: 29.3 pg (ref 26.0–34.0)
MCHC: 31.7 g/dL (ref 30.0–36.0)
MCV: 92.6 fL (ref 80.0–100.0)
Platelets: 156 10*3/uL (ref 150–400)
RBC: 5.66 MIL/uL (ref 4.22–5.81)
RDW: 12.6 % (ref 11.5–15.5)
WBC: 14.6 10*3/uL — ABNORMAL HIGH (ref 4.0–10.5)
nRBC: 0 % (ref 0.0–0.2)

## 2020-04-20 LAB — LIPASE, BLOOD: Lipase: 29 U/L (ref 11–51)

## 2020-04-20 LAB — TROPONIN I (HIGH SENSITIVITY): Troponin I (High Sensitivity): 11 ng/L (ref <18)

## 2020-04-20 NOTE — ED Triage Notes (Signed)
Pt to ED via EMS from home c/o failure to thrive.  States v/d several times today, denies pain, intermittent SOB.  Dx with COVID 3 weeks ago.  Pt seen recently but unsure why.  Not eating for last week, decreased urine output.  EMS vitals 152/88 BP, 87 HR, 94% RA, 143 CBG.  Pt alert and answering questions, oriented to self and place, disoriented to time.

## 2020-04-20 NOTE — ED Provider Notes (Signed)
[facility] Emergency Department Provider Note ____________________________________________   Event Date/Time   First MD Initiated Contact with Patient 04/20/20 2343     (approximate)  I have reviewed the triage vital signs and the nursing notes.  HISTORY  Chief Complaint Failure To Thrive and Vomiting   HPI Zachary Gardner is a 75 y.o. malewho presents to the ED for evaluation of failure to thrive.  Chart review indicates bipolar disorder on lithium, HTN and GERD. Patient seen in our ED 5 days ago due to subacute fatigue and decreased appetite after COVID-19.  Reportedly tested positive for COVID-19 mid January.  Patient presents to the ED via EMS from home due to complaints of continued poor appetite, fatigue, postprandial emesis and diarrhea.  He reports he feels similarly as when he was here 5 days ago, and reports that he "just cannot get any better."  He reports continued fatigue, poor p.o. intake due to lack of appetite, and reports postprandial nausea and emesis with nonbloody nonbilious emesis.  Further reporting a couple episodes of watery diarrhea per day without hematochezia or melena.  He reports associated lightheaded presyncopal dizziness without syncope.  Denies any falls, trauma, chest pain, fever, cough, shortness of breath, dysuria.  He reports "occasionally" feeling abdominal discomfort when he vomits, but denies discrete abdominal pain.  Lives at home with his wife, but his son lives nearby and urged him to get evaluated today due to continued symptoms.   Past Medical History:  Diagnosis Date  . Bipolar disorder ([place])   . Dysplastic nevus 03/30/2019   Left abdomen, moderate to severa atypia, excision   . GERD (gastroesophageal reflux disease)   . Hypertension     Patient Active Problem List   Diagnosis Date Noted  . AKI (acute kidney injury) ([place]) 04/21/2020  . Enterocolitis 04/21/2020  . Generalized weakness 04/21/2020  . Lithium  toxicity 04/21/2020  . History of COVID-19 04/21/2020    Past Surgical History:  Procedure Laterality Date  . COLONOSCOPY    . CYST EXCISION     BACK OF LEG  . CYSTOSCOPY WITH INSERTION OF UROLIFT N/A 11/17/2019   Procedure: CYSTOSCOPY WITH INSERTION OF UROLIFT;  Surgeon: Abbie Sons, MD;  Location: [facility] ORS;  Service: Urology;  Laterality: N/A;  . HERNIA REPAIR  2004 AND 07-2019   [facility]    Prior to Admission medications   Medication Sig Start Date End Date Taking? Authorizing Provider  Azelastine HCl 137 MCG/SPRAY SOLN Place 1 spray into both nostrils 2 (two) times daily. 04/09/20  Yes [provider]  benzonatate (TESSALON) 200 MG capsule Take 200 mg by mouth 3 (three) times daily as needed. 04/09/20  Yes [provider]  cholecalciferol (VITAMIN D) 25 MCG (1000 UNIT) tablet Take 1,000 Units by mouth daily.   Yes [provider]  finasteride (PROSCAR) 5 MG tablet Take 5 mg by mouth at bedtime.  03/11/19  Yes [provider]  ketoconazole (NIZORAL) 2 % shampoo Apply 1 application topically daily. Wash scalp, let sit 5 minutes and rinse out 03/07/20  Yes Brendolyn Patty, MD  lisinopril (ZESTRIL) 10 MG tablet Take 10 mg by mouth at bedtime.   Yes [provider]  lithium 300 MG tablet Take 750 mg by mouth at bedtime.   Yes [provider]  loperamide (IMODIUM) 2 MG capsule Take 2 mg by mouth in the morning and at bedtime. TAKES THIS FOR HIS LACTOSE INTOLERANCE  Yes [provider]  naproxen sodium (ALEVE) 220 MG tablet Take 220-440 mg by mouth 2 (two) times daily as needed (pain.).   Yes [provider]  Omeprazole 20 MG TBEC Take 20 mg by mouth in the morning and at bedtime.  03/11/19  Yes [provider]  silodosin (RAPAFLO) 8 MG CAPS capsule Take 1 capsule (8 mg total) by mouth daily with breakfast. Patient taking differently: Take 8 mg by mouth at bedtime. 08/27/19  Yes  Stoioff, Ronda Fairly, MD  simvastatin (ZOCOR) 40 MG tablet Take 40 mg by mouth at bedtime.    Yes [provider]  terazosin (HYTRIN) 2 MG capsule Take 1 capsule by mouth at bedtime. 10/29/19  Yes [provider]  terbinafine (LAMISIL) 250 MG tablet Take 1 tablet (250 mg total) by mouth daily. 03/08/20  Yes Brendolyn Patty, MD  traZODone (DESYREL) 50 MG tablet Take 50 mg by mouth at bedtime.   Yes [provider]  triamcinolone (KENALOG) 0.1 % Apply 1 application topically as directed. Qd to bid up to 5 days per week aa arms until clear, then prn flares, avoid face, groin 03/07/20  Yes Brendolyn Patty, MD  vitamin B-12 (CYANOCOBALAMIN) 1000 MCG tablet Take 1,000 mcg by mouth daily.   Yes [provider]    Allergies Lactose intolerance (gi)  History reviewed. No pertinent family history.  Social History Social History   Tobacco Use  . Smoking status: Never Smoker  . Smokeless tobacco: Never Used  Vaping Use  . Vaping Use: Never used  Substance Use Topics  . Alcohol use: Never  . Drug use: Never    Review of Systems  Constitutional: No fever/chills.  Positive for generalized weakness. Eyes: No visual changes. ENT: No sore throat. Cardiovascular: Denies chest pain. Respiratory: Denies shortness of breath. Gastrointestinal: Positive for nausea, vomiting and diarrhea..  No constipation. Genitourinary: Negative for dysuria. Musculoskeletal: Negative for back pain. Skin: Negative for rash. Neurological: Negative for headaches, focal weakness or numbness.  ____________________________________________   PHYSICAL EXAM:  VITAL SIGNS: Vitals:   04/21/20 0500 04/21/20 0530  BP: 113/67 107/73  Pulse: (!) 56 (!) 55  Resp: 10 14  Temp:    SpO2: 100% 100%     Constitutional: Alert and oriented. Well appearing and in no acute distress. Eyes: Conjunctivae are normal. PERRL. EOMI. Head: Atraumatic. Nose: No congestion/rhinnorhea. Mouth/Throat: Mucous  membranes are dry.  Oropharynx non-erythematous. Neck: No stridor. No cervical spine tenderness to palpation. Cardiovascular: Normal rate, regular rhythm. Grossly normal heart sounds.  Good peripheral circulation. Respiratory: Normal respiratory effort.  No retractions. Lungs CTAB. Gastrointestinal: Soft , nondistended. No CVA tenderness. Mild RUQ tenderness with deep palpation.  Otherwise benign abdomen.  No peritoneal features. Musculoskeletal: No lower extremity tenderness nor edema.  No joint effusions. No signs of acute trauma. Neurologic:  Normal speech and language. No gross focal neurologic deficits are appreciated. No gait instability noted. Skin:  Skin is warm, dry and intact. No rash noted. Psychiatric: Mood and affect are normal. Speech and behavior are normal.  ____________________________________________   LABS (all labs ordered are listed, but only abnormal results are displayed)  Labs Reviewed  COMPREHENSIVE METABOLIC PANEL - Abnormal; Notable for the following components:      Result Value   Glucose, Bld 134 (*)    BUN 60 (*)    Creatinine, Ser 1.64 (*)    Calcium 11.1 (*)    Total Bilirubin 1.3 (*)    GFR, Estimated 43 (*)  All other components within normal limits  CBC - Abnormal; Notable for the following components:   WBC 14.6 (*)    HCT 52.4 (*)    All other components within normal limits  LITHIUM LEVEL - Abnormal; Notable for the following components:   Lithium Lvl 2.10 (*)    All other components within normal limits  GASTROINTESTINAL PANEL BY PCR, STOOL (REPLACES STOOL CULTURE)  LIPASE, BLOOD  URINALYSIS, COMPLETE (UACMP) WITH MICROSCOPIC  LITHIUM LEVEL  TROPONIN I (HIGH SENSITIVITY)   ____________________________________________  12 Lead EKG  Sinus rhythm, rate of 94 bpm.  Leftward axis.  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  QTc 475.  No evidence of acute ischemia. ____________________________________________  RADIOLOGY  ED  MD interpretation: CT abdomen/pelvis reviewed by me with mild terminal ileal wall thickening  Official radiology report(s): CT ABDOMEN PELVIS W CONTRAST  Result Date: 04/21/2020 CLINICAL DATA:  Multiple episodes of vomiting and diarrhea, intermittent shortness of breath, diagnosed with COVID 19 3 weeks prior recently evaluated but unsure why EXAM: CT ABDOMEN AND PELVIS WITH CONTRAST TECHNIQUE: Multidetector CT imaging of the abdomen and pelvis was performed using the standard protocol following bolus administration of intravenous contrast. CONTRAST:  12mL OMNIPAQUE IOHEXOL 300 MG/ML  SOLN COMPARISON:  None. FINDINGS: Lower chest: Some dependent ground-glass in the right lung base, likely atelectatic. Included lung bases are otherwise clear. Normal heart size. No pericardial effusion. Coronary artery calcifications are present. Hepatobiliary: No worrisome focal liver lesions. Smooth liver surface contour. Normal hepatic attenuation. Normal gallbladder and biliary tree without visible calcified gallstone. Pancreas: Partial fatty replacement of the pancreas. No pancreatic ductal dilatation or surrounding inflammatory changes. Spleen: Normal in size. No concerning splenic lesions. Adrenals/Urinary Tract: Mild lobular thickening of the adrenal glands without concerning dominant nodule can reflect some senescent adrenal hyperplasia. Kidneys are normally located with symmetric enhancement and excretion. A 1.4 cm fluid attenuation parapelvic cyst in the lower pole kidney. No suspicious renal lesion, urolithiasis or hydronephrosis. Circumferential bladder wall thickening. May reflect mild trabeculation with indentation of the bladder base by an enlarged prostate. Stomach/Bowel: Distal esophagus, stomach and duodenum are unremarkable. While the proximal bowel is fairly unremarkable. Portions of the terminal ileum in much of the colon demonstrates some very mild mural thickening and mucosal hyperemia. No evidence of  obstruction. Normal appendix in the right lower quadrant coursing along the right pelvic sidewall. Vascular/Lymphatic: Atherosclerotic calcifications within the abdominal aorta and branch vessels. No aneurysm or ectasia. No enlarged abdominopelvic lymph nodes. Reproductive: Enlarged prostate with some irregular median lobe hypertrophy. Brachytherapy implants are noted. Few coarse prostate calcifications noted posteriorly. Other: No abdominopelvic free fluid or free gas. No bowel containing hernias. Postsurgical changes in the bilateral inguinal soft tissues including a left groin hernia mesh. Musculoskeletal: Multilevel degenerative changes are present in the imaged portions of the spine. No acute osseous abnormality or suspicious osseous lesion. IMPRESSION: 1. Portions of the terminal ileum and much of the colon demonstrate some very mild mural thickening and mucosal hyperemia, suggestive of a mild infectious or inflammatory enterocolitis. No evidence of obstruction. 2. Circumferential bladder wall thickening in the setting of some irregular median lobe hypertrophy of the prostate. May reflect mild trabeculation as sequela of chronic outlet obstruction though should correlate with urinalysis to exclude superimposed cystitis. 3. Aortic Atherosclerosis (ICD10-I70.0). Electronically Signed   By: Lovena Le M.D.   On: 04/21/2020 02:22    ____________________________________________   PROCEDURES and INTERVENTIONS  Procedure(s) performed (including Critical Care):  .1-3 Lead  EKG Interpretation Performed by: Vladimir Crofts, MD Authorized by: Vladimir Crofts, MD     Interpretation: normal     ECG rate:  74   ECG rate assessment: normal     Rhythm: sinus rhythm     Ectopy: none     Conduction: normal      Medications  enoxaparin (LOVENOX) injection 40 mg (has no administration in time range)  0.9 %  sodium chloride infusion ( Intravenous New Bag/Given 04/21/20 0505)  acetaminophen (TYLENOL) tablet 650  mg (has no administration in time range)    Or  acetaminophen (TYLENOL) suppository 650 mg (has no administration in time range)  ondansetron (ZOFRAN) tablet 4 mg (has no administration in time range)    Or  ondansetron (ZOFRAN) injection 4 mg (has no administration in time range)  lactated ringers bolus 1,000 mL (0 mLs Intravenous Stopped 04/21/20 0320)  acetaminophen (TYLENOL) tablet 1,000 mg (1,000 mg Oral Given 04/21/20 0020)  iohexol (OMNIPAQUE) 300 MG/ML solution 75 mL (75 mLs Intravenous Contrast Given 04/21/20 0155)    ____________________________________________   MDM / ED COURSE   75 year old male who presents to the ED with subacute weakness and with GI symptoms, found to have evidence of lithium toxicity, necessitating observation medical admission.  Hemodynamically stable.  Exam demonstrates some mild upper quadrant tenderness, but is otherwise benign.  He does have some stigmata of dehydration on examination.  Otherwise has no distress, neurovascular deficits, peritoneal abdomen or signs of trauma.  Blood work with elevated BUN and slight increase in creatinine, concerning for prerenal mild AKI.  Due to his GI symptoms, CT imaging obtained and demonstrates evidence of mild terminal ileal colitis.  Blood work later returns with significantly elevated lithium level.  Due to his continued GI symptoms, elevated lithium level and unsafe discharge plan, I will discuss the case with hospitalist medicine for observation admission.  Clinical Course as of 04/21/20 0645  Thu Apr 21, 2020  0259 Reassessed.  Patient reports he continues to feel poorly.  We discussed his elevated lithium level and CT imaging.  We discussed my recommendation for medical observation admission, he is agreeable. [DS]    Clinical Course User Index [DS] Vladimir Crofts, MD    ____________________________________________   FINAL CLINICAL IMPRESSION(S) / ED DIAGNOSES  Final diagnoses:  Lithium toxicity, accidental  or unintentional, initial encounter  Nausea vomiting and diarrhea  Generalized weakness     ED Discharge Orders    None       Dylan Tamala Julian   Note:  This document was prepared using Dragon voice recognition software and may include unintentional dictation errors.   Vladimir Crofts, MD 04/21/20 [id number]

## 2020-04-21 ENCOUNTER — Other Ambulatory Visit: Payer: Self-pay

## 2020-04-21 ENCOUNTER — Encounter: Payer: Self-pay | Admitting: Internal Medicine

## 2020-04-21 ENCOUNTER — Emergency Department: Payer: No Typology Code available for payment source

## 2020-04-21 DIAGNOSIS — R627 Adult failure to thrive: Secondary | ICD-10-CM | POA: Diagnosis present

## 2020-04-21 DIAGNOSIS — R531 Weakness: Secondary | ICD-10-CM

## 2020-04-21 DIAGNOSIS — E739 Lactose intolerance, unspecified: Secondary | ICD-10-CM | POA: Diagnosis present

## 2020-04-21 DIAGNOSIS — K219 Gastro-esophageal reflux disease without esophagitis: Secondary | ICD-10-CM | POA: Diagnosis present

## 2020-04-21 DIAGNOSIS — N179 Acute kidney failure, unspecified: Secondary | ICD-10-CM

## 2020-04-21 DIAGNOSIS — K529 Noninfective gastroenteritis and colitis, unspecified: Secondary | ICD-10-CM | POA: Diagnosis present

## 2020-04-21 DIAGNOSIS — R1310 Dysphagia, unspecified: Secondary | ICD-10-CM | POA: Diagnosis present

## 2020-04-21 DIAGNOSIS — F319 Bipolar disorder, unspecified: Secondary | ICD-10-CM | POA: Diagnosis present

## 2020-04-21 DIAGNOSIS — Z8616 Personal history of COVID-19: Secondary | ICD-10-CM

## 2020-04-21 DIAGNOSIS — E860 Dehydration: Secondary | ICD-10-CM | POA: Diagnosis present

## 2020-04-21 DIAGNOSIS — I10 Essential (primary) hypertension: Secondary | ICD-10-CM | POA: Diagnosis present

## 2020-04-21 DIAGNOSIS — T43591A Poisoning by other antipsychotics and neuroleptics, accidental (unintentional), initial encounter: Secondary | ICD-10-CM | POA: Diagnosis present

## 2020-04-21 DIAGNOSIS — E43 Unspecified severe protein-calorie malnutrition: Secondary | ICD-10-CM | POA: Diagnosis present

## 2020-04-21 DIAGNOSIS — I951 Orthostatic hypotension: Secondary | ICD-10-CM | POA: Diagnosis present

## 2020-04-21 DIAGNOSIS — Z515 Encounter for palliative care: Secondary | ICD-10-CM | POA: Diagnosis not present

## 2020-04-21 DIAGNOSIS — F32A Depression, unspecified: Secondary | ICD-10-CM | POA: Diagnosis present

## 2020-04-21 DIAGNOSIS — T56891A Toxic effect of other metals, accidental (unintentional), initial encounter: Secondary | ICD-10-CM | POA: Diagnosis not present

## 2020-04-21 DIAGNOSIS — I4510 Unspecified right bundle-branch block: Secondary | ICD-10-CM | POA: Diagnosis present

## 2020-04-21 DIAGNOSIS — R45851 Suicidal ideations: Secondary | ICD-10-CM | POA: Diagnosis present

## 2020-04-21 DIAGNOSIS — Z79899 Other long term (current) drug therapy: Secondary | ICD-10-CM | POA: Diagnosis not present

## 2020-04-21 LAB — LITHIUM LEVEL
Lithium Lvl: 2.1 mmol/L (ref 0.60–1.20)
Lithium Lvl: 2.06 mmol/L (ref 0.60–1.20)

## 2020-04-21 MED ORDER — ENOXAPARIN SODIUM 40 MG/0.4ML ~~LOC~~ SOLN
40.0000 mg | SUBCUTANEOUS | Status: DC
  Administered 2020-04-21 – 2020-04-26 (×6): 40 mg via SUBCUTANEOUS
  Filled 2020-04-21 (×6): qty 0.4

## 2020-04-21 MED ORDER — ACETAMINOPHEN 650 MG RE SUPP: 650.0000 mg | Freq: Four times a day (QID) | RECTAL | Status: DC | PRN

## 2020-04-21 MED ORDER — SODIUM CHLORIDE 0.9 % IV SOLN: INTRAVENOUS | Status: DC

## 2020-04-21 MED ORDER — ONDANSETRON HCL 4 MG PO TABS: 4.0000 mg | ORAL_TABLET | Freq: Four times a day (QID) | ORAL | Status: DC | PRN

## 2020-04-21 MED ORDER — ACETAMINOPHEN 500 MG PO TABS
1000.0000 mg | ORAL_TABLET | Freq: Once | ORAL | Status: AC
  Administered 2020-04-21: 1000 mg via ORAL
  Filled 2020-04-21: qty 2

## 2020-04-21 MED ORDER — ACETAMINOPHEN 325 MG PO TABS
650.0000 mg | ORAL_TABLET | Freq: Four times a day (QID) | ORAL | Status: DC | PRN
  Administered 2020-04-22 – 2020-04-26 (×4): 650 mg via ORAL
  Filled 2020-04-21 (×4): qty 2

## 2020-04-21 MED ORDER — IOHEXOL 300 MG/ML  SOLN
75.0000 mL | Freq: Once | INTRAMUSCULAR | Status: AC | PRN
  Administered 2020-04-21: 75 mL via INTRAVENOUS

## 2020-04-21 MED ORDER — ONDANSETRON HCL 4 MG/2ML IJ SOLN: 4.0000 mg | Freq: Four times a day (QID) | INTRAMUSCULAR | Status: DC | PRN

## 2020-04-21 MED ORDER — LACTATED RINGERS IV BOLUS
1000.0000 mL | Freq: Once | INTRAVENOUS | Status: AC
  Administered 2020-04-21: 1000 mL via INTRAVENOUS

## 2020-04-21 NOTE — ED Notes (Signed)
Lab contacted to collect lithium level.

## 2020-04-21 NOTE — H&P (Addendum)
History and Physical    [name] EHU:[id number] DOB: [birth year] DOA: 04/20/2020  PCP: Derinda Late, MD   Patient coming from: home  I have personally briefly reviewed patient's old medical records in [place]  Chief Complaint: Weakness, malaise, vomiting, diarrhea  HPI: Zachary Gardner is a 75 y.o. male with medical history significant for bipolar mood disorder on lithium, HTN and GERD, with COVID-19 infection with mild symptoms 3 weeks prior, who presents to the emergency room for the second time in 5 days with a 1 week complaint of vomiting, diarrhea, protracted nausea with decreased appetite and generalized malaise that started several days after his recovery from his respiratory Covid symptoms.  He did not have vomiting and diarrhea with COVID.  States he does not vomit every day but he feels nauseated every times he eats.  Vomitus is nonbloody and nonbilious.  He has watery diarrhea daily without hematochezia or melena.  He has associated lightheadedness but denies any falls.  He denies fever or chills.  Has an occasional crampy abdominal discomfort.  On his first visit to the emergency room, on 2/19 he was hydrated and discharged and his symptoms were attributed to his recent COVID infection ED Course: vitals unremarkable.  Blood work significant for leukocytosis of 14,000, creatinine 1.64 elevated calcium of 11.1, lipase of 29 and lithium level of 2.10 EKG as reviewed by me : NSR at 94 with RBBB Imaging: CT abdomen and pelvis with Portions of the terminal ileum and much of the colon demonstrate some very mild mural thickening and mucosal hyperemia, suggestive of a mild infectious or inflammatory enterocolitis. No evidence of Obstruction  Patient started on IV hydration.  Hospitalist consulted for admission.  Review of Systems: As per HPI otherwise all other systems on review of systems negative.    Past Medical History:  Diagnosis Date  . Bipolar disorder ([place])   .  Dysplastic nevus 03/30/2019   Left abdomen, moderate to severa atypia, excision   . GERD (gastroesophageal reflux disease)   . Hypertension     Past Surgical History:  Procedure Laterality Date  . COLONOSCOPY    . CYST EXCISION     BACK OF LEG  . CYSTOSCOPY WITH INSERTION OF UROLIFT N/A 11/17/2019   Procedure: CYSTOSCOPY WITH INSERTION OF UROLIFT;  Surgeon: Abbie Sons, MD;  Location: [facility] ORS;  Service: Urology;  Laterality: N/A;  . HERNIA REPAIR  2004 AND 07-2019   [facility]     reports that he has never smoked. He has never used smokeless tobacco. He reports that he does not drink alcohol and does not use drugs.  Allergies  Allergen Reactions  . Lactose Intolerance ([place])     History reviewed. No pertinent family history.    Prior to Admission medications   Medication Sig Start Date End Date Taking? Authorizing Provider  terazosin (HYTRIN) 2 MG capsule Take 1 capsule by mouth at bedtime. 10/29/19  Yes [provider]  Azelastine HCl 137 MCG/SPRAY SOLN Place 1 spray into both nostrils 2 (two) times daily. 04/09/20   [provider]  benzonatate (TESSALON) 200 MG capsule Take 200 mg by mouth 3 (three) times daily as needed. 04/09/20   [provider]  cholecalciferol (VITAMIN D) 25 MCG (1000 UNIT) tablet Take 1,000 Units by mouth daily.    [provider]  finasteride (PROSCAR) 5 MG tablet Take 5 mg by mouth at bedtime.  03/11/19   [provider]  ketoconazole (NIZORAL) 2 % shampoo Apply 1 application topically daily. Wash scalp, let sit 5 minutes and rinse out 03/07/20   Brendolyn Patty, MD  lisinopril (ZESTRIL) 10 MG tablet Take 10 mg by mouth at bedtime.    [provider]  lithium 300 MG tablet Take 300 mg by mouth at bedtime.     [provider]  loperamide (IMODIUM) 2 MG capsule Take 2 mg by mouth in the morning and at bedtime. TAKES THIS FOR HIS LACTOSE INTOLERANCE    [provider]  naproxen sodium (ALEVE) 220 MG tablet Take 220-440 mg by mouth 2 (two) times daily as needed (pain.).    [provider]  Omeprazole 20 MG TBEC Take 20 mg by mouth in the morning and at bedtime.  03/11/19   [provider]  silodosin (RAPAFLO) 8 MG CAPS capsule Take 1 capsule (8 mg total) by mouth daily with breakfast. Patient taking differently: Take 8 mg by mouth at bedtime.  08/27/19   Stoioff, Ronda Fairly, MD  simvastatin (ZOCOR) 40 MG tablet Take 40 mg by mouth at bedtime.     [provider]  terbinafine (LAMISIL) 250 MG tablet Take 1 tablet (250 mg total) by mouth daily. 03/08/20   Brendolyn Patty, MD  triamcinolone (KENALOG) 0.1 % Apply 1 application topically as directed. Qd to bid up to 5 days per week aa arms until clear, then prn flares, avoid face, groin 03/07/20   Brendolyn Patty, MD  vitamin B-12 (CYANOCOBALAMIN) 1000 MCG tablet Take 1,000 mcg by mouth daily.    [provider]    Physical Exam: Vitals:   04/21/20 0030 04/21/20 0037 04/21/20 0100 04/21/20 0224  BP: 124/74  102/66 129/70  Pulse: 72  70 70  Resp: 14  14 18   Temp:      TempSrc:      SpO2: 100% 100% 100% 100%  Weight:      Height:         Vitals:   04/21/20 0030 04/21/20 0037 04/21/20 0100 04/21/20 0224  BP: 124/74  102/66 129/70  Pulse: 72  70 70  Resp: 14  14 18   Temp:      TempSrc:      SpO2: 100% 100% 100% 100%  Weight:      Height:          Constitutional:  Frail and ill-appearing male, lethargic but oriented x3 . Not in any apparent distress HEENT:      Head: Normocephalic and atraumatic.         Eyes: PERLA, EOMI, Conjunctivae are normal. Sclera is non-icteric.       Mouth/Throat: Mucous membranes are moist.       Neck: Supple with no signs of meningismus. Cardiovascular: Regular rate and rhythm. No murmurs, gallops, or rubs. 2+ symmetrical distal pulses are present . No JVD. No LE edema Respiratory: Respiratory effort normal .Lungs sounds clear  bilaterally. No wheezes, crackles, or rhonchi.  Gastrointestinal: Soft, non tender, and non distended with positive bowel sounds.  Genitourinary: No CVA tenderness. Musculoskeletal: Nontender with normal range of motion in all extremities. No cyanosis, or erythema of extremities. Neurologic:  Face is symmetric. Moving all extremities. No gross focal neurologic deficits . Skin: Skin is warm, dry.  No rash or ulcers Psychiatric: Mood and affect are normal    Labs on Admission: I have personally reviewed following labs and imaging studies  CBC: Recent Labs  Lab 04/16/20 1211 04/20/20 2032  WBC  10.3 14.6*  HGB 16.3 16.6  HCT 50.5 52.4*  MCV 91.3 92.6  PLT 212 403   Basic Metabolic Panel: Recent Labs  Lab 04/16/20 1240 04/20/20 2032  NA 142 142  K 4.3 4.2  CL 107 111  CO2 23 22  GLUCOSE 157* 134*  BUN 53* 60*  CREATININE 1.47* 1.64*  CALCIUM 10.5* 11.1*   GFR: Estimated Creatinine Clearance: 37.1 mL/min (A) (by C-G formula based on SCr of 1.64 mg/dL (H)). Liver Function Tests: Recent Labs  Lab 04/16/20 1240 04/20/20 2032  AST 22 17  ALT 46* 17  ALKPHOS 75 85  BILITOT 1.5* 1.3*  PROT 7.9 7.7  ALBUMIN 4.6 4.5   Recent Labs  Lab 04/20/20 2032  LIPASE 29   No results for input(s): AMMONIA in the last 168 hours. Coagulation Profile: No results for input(s): INR, PROTIME in the last 168 hours. Cardiac Enzymes: No results for input(s): CKTOTAL, CKMB, CKMBINDEX, TROPONINI in the last 168 hours. BNP (last 3 results) No results for input(s): PROBNP in the last 8760 hours. HbA1C: No results for input(s): HGBA1C in the last 72 hours. CBG: No results for input(s): GLUCAP in the last 168 hours. Lipid Profile: No results for input(s): CHOL, HDL, LDLCALC, TRIG, CHOLHDL, LDLDIRECT in the last 72 hours. Thyroid Function Tests: No results for input(s): TSH, T4TOTAL, FREET4, T3FREE, THYROIDAB in the last 72 hours. Anemia Panel: No results for input(s): VITAMINB12,  FOLATE, FERRITIN, TIBC, IRON, RETICCTPCT in the last 72 hours. Urine analysis:    Component Value Date/Time   COLORURINE YELLOW (A) 08/14/2019 2036   APPEARANCEUR Clear 11/05/2019 0917   LABSPEC 1.014 08/14/2019 2036   PHURINE 6.0 08/14/2019 2036   GLUCOSEU Negative 11/05/2019 0917   HGBUR SMALL (A) 08/14/2019 2036   BILIRUBINUR Negative 11/05/2019 [place] 08/14/2019 2036   PROTEINUR Negative 11/05/2019 0917   PROTEINUR NEGATIVE 08/14/2019 2036   NITRITE Negative 11/05/2019 0917   NITRITE NEGATIVE 08/14/2019 2036   LEUKOCYTESUR Negative 11/05/2019 0917   LEUKOCYTESUR NEGATIVE 08/14/2019 2036    Radiological Exams on Admission: CT ABDOMEN PELVIS W CONTRAST  Result Date: 04/21/2020 CLINICAL DATA:  Multiple episodes of vomiting and diarrhea, intermittent shortness of breath, diagnosed with COVID 19 3 weeks prior recently evaluated but unsure why EXAM: CT ABDOMEN AND PELVIS WITH CONTRAST TECHNIQUE: Multidetector CT imaging of the abdomen and pelvis was performed using the standard protocol following bolus administration of intravenous contrast. CONTRAST:  84mL OMNIPAQUE IOHEXOL 300 MG/ML  SOLN COMPARISON:  None. FINDINGS: Lower chest: Some dependent ground-glass in the right lung base, likely atelectatic. Included lung bases are otherwise clear. Normal heart size. No pericardial effusion. Coronary artery calcifications are present. Hepatobiliary: No worrisome focal liver lesions. Smooth liver surface contour. Normal hepatic attenuation. Normal gallbladder and biliary tree without visible calcified gallstone. Pancreas: Partial fatty replacement of the pancreas. No pancreatic ductal dilatation or surrounding inflammatory changes. Spleen: Normal in size. No concerning splenic lesions. Adrenals/Urinary Tract: Mild lobular thickening of the adrenal glands without concerning dominant nodule can reflect some senescent adrenal hyperplasia. Kidneys are normally located with symmetric  enhancement and excretion. A 1.4 cm fluid attenuation parapelvic cyst in the lower pole kidney. No suspicious renal lesion, urolithiasis or hydronephrosis. Circumferential bladder wall thickening. May reflect mild trabeculation with indentation of the bladder base by an enlarged prostate. Stomach/Bowel: Distal esophagus, stomach and duodenum are unremarkable. While the proximal bowel is fairly unremarkable. Portions of the terminal ileum in much of the colon demonstrates some very  mild mural thickening and mucosal hyperemia. No evidence of obstruction. Normal appendix in the right lower quadrant coursing along the right pelvic sidewall. Vascular/Lymphatic: Atherosclerotic calcifications within the abdominal aorta and branch vessels. No aneurysm or ectasia. No enlarged abdominopelvic lymph nodes. Reproductive: Enlarged prostate with some irregular median lobe hypertrophy. Brachytherapy implants are noted. Few coarse prostate calcifications noted posteriorly. Other: No abdominopelvic free fluid or free gas. No bowel containing hernias. Postsurgical changes in the bilateral inguinal soft tissues including a left groin hernia mesh. Musculoskeletal: Multilevel degenerative changes are present in the imaged portions of the spine. No acute osseous abnormality or suspicious osseous lesion. IMPRESSION: 1. Portions of the terminal ileum and much of the colon demonstrate some very mild mural thickening and mucosal hyperemia, suggestive of a mild infectious or inflammatory enterocolitis. No evidence of obstruction. 2. Circumferential bladder wall thickening in the setting of some irregular median lobe hypertrophy of the prostate. May reflect mild trabeculation as sequela of chronic outlet obstruction though should correlate with urinalysis to exclude superimposed cystitis. 3. Aortic Atherosclerosis (ICD10-I70.0). Electronically Signed   By: Lovena Le M.D.   On: 04/21/2020 02:22     Assessment/Plan 75 year old male with  bipolar mood disorder on lithium, HTN and GERD, with COVID-19 infection with mild symptoms 3 weeks prior with respiratory symptoms presenting with a 1 week complaint of vomiting, diarrhea, protracted nausea with decreased appetite and generalized malaise that started several days after his recovery from his respiratory Covid symptoms.    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   Bipolar mood disorder -Lithium level elevated at 2.1 while taking lithium therapeutically but patient without neurologic symptoms -Lithium level 2.1, likely related to fluid losses with resulting dehydration from gastrointestinal illness -Overdose, intentional or nonintentional not suspected -Continue IV hydration -Monitor lithium levels -Continue to monitor renal function -Hold home lithium    AKI (acute kidney injury) ([place])  -Believed prerenal and secondary to gastrointestinal losses more so than to lithium toxicity -Hydrate and monitor renal function -Nephrology consult if not improving with hydration    Enterocolitis with history of COVID-19 -Possibly related to COVID-19 from 3 weeks prior -States vomiting and diarrhea started 1 week ago after recovery of his respiratory symptoms -CT abdomen and pelvis consistent with enterocolitis -Continue IV hydration -Follow GI panel -Clear liquid diet advance as tolerated -Supportive care    Generalized weakness -Secondary to enterocolitis with dehydration and lithium toxicity -Treat acute conditions as outlined above -Can consider PT consult.    DVT prophylaxis: Lovenox  Code Status: full code  Family Communication:  none  Disposition Plan: Back to previous home environment Consults called: none  Status:The patient will observe these symptoms, and report promptly any worsening or unexpected persistence.  If well, may return prn.     Athena Masse MD [facility]     04/21/2020, 4:05 AM

## 2020-04-21 NOTE — Consult Note (Signed)
[address] [place], [place] [address] Phone [phone]. Fax [phone]  Date: 04/21/2020                  Patient Name:  Zachary Gardner  MRN: [id number]  DOB: 11/12/1945  Age / Sex: 75 y.o., male         PCP: Derinda Late, MD                 Service Requesting Consult: IM/ Fritzi Mandes, MD                 Reason for Consult: ARF            History of Present Illness: Patient is a 75 y.o. male presented to the ER not feeling well current loss of appetite, problems with vomiting, diarrhea Was diagnosed with COVID about 3 weeks ago Work-up so far shows dehydration, elevated lithium levels, elevated creatinine levels and hypercalcemia   Medications: Outpatient medications: Medications Prior to Admission  Medication Sig Dispense Refill Last Dose  . Azelastine HCl 137 MCG/SPRAY SOLN Place 1 spray into both nostrils 2 (two) times daily.   Past Week at Unknown time  . benzonatate (TESSALON) 200 MG capsule Take 200 mg by mouth 3 (three) times daily as needed.   prn at prn  . cholecalciferol (VITAMIN D) 25 MCG (1000 UNIT) tablet Take 1,000 Units by mouth daily.   Past Week at Unknown time  . finasteride (PROSCAR) 5 MG tablet Take 5 mg by mouth at bedtime.    04/19/2020 at 2200  . ketoconazole (NIZORAL) 2 % shampoo Apply 1 application topically daily. Wash scalp, let sit 5 minutes and rinse out 120 mL 3 Past Week at Unknown time  . lisinopril (ZESTRIL) 10 MG tablet Take 10 mg by mouth at bedtime.   04/19/2020 at 2200  . lithium 300 MG tablet Take 750 mg by mouth at bedtime.   04/19/2020 at 2200  . loperamide (IMODIUM) 2 MG capsule Take 2 mg by mouth in the morning and at bedtime. TAKES THIS FOR HIS LACTOSE INTOLERANCE   prn at prn  . naproxen sodium (ALEVE) 220 MG tablet Take 220-440 mg by mouth 2 (two) times daily as needed (pain.).   prn at prn  . Omeprazole 20 MG TBEC Take 20 mg by mouth in the morning and at bedtime.    04/19/2020 at 2200  . silodosin (RAPAFLO) 8 MG  CAPS capsule Take 1 capsule (8 mg total) by mouth daily with breakfast. (Patient taking differently: Take 8 mg by mouth at bedtime.) 30 capsule 3 04/19/2020 at 2200  . simvastatin (ZOCOR) 40 MG tablet Take 40 mg by mouth at bedtime.    04/19/2020 at 2200  . terazosin (HYTRIN) 2 MG capsule Take 1 capsule by mouth at bedtime.   04/19/2020 at 2200  . terbinafine (LAMISIL) 250 MG tablet Take 1 tablet (250 mg total) by mouth daily. 30 tablet 0 04/19/2020 at 2200  . traZODone (DESYREL) 50 MG tablet Take 50 mg by mouth at bedtime.   04/19/2020 at 2200  . triamcinolone (KENALOG) 0.1 % Apply 1 application topically as directed. Qd to bid up to 5 days per week aa arms until clear, then prn flares, avoid face, groin 80 g 1 prn at prn  . vitamin B-12 (CYANOCOBALAMIN) 1000 MCG tablet Take 1,000 mcg by mouth daily.   Past Week at Unknown time    Current medications: Current Facility-Administered Medications  Medication Dose Route Frequency Provider Last  Rate Last Admin  . 0.9 %  sodium chloride infusion   Intravenous Continuous Athena Masse, MD 125 mL/hr at 04/21/20 0727 Rate Verify at 04/21/20 0727  . acetaminophen (TYLENOL) tablet 650 mg  650 mg Oral Q6H PRN Athena Masse, MD       Or  . acetaminophen (TYLENOL) suppository 650 mg  650 mg Rectal Q6H PRN Athena Masse, MD      . enoxaparin (LOVENOX) injection 40 mg  40 mg Subcutaneous Q24H Judd Gaudier V, MD      . ondansetron [facility]) tablet 4 mg  4 mg Oral Q6H PRN Athena Masse, MD       Or  . ondansetron [facility]) injection 4 mg  4 mg Intravenous Q6H PRN Athena Masse, MD          Allergies: Allergies  Allergen Reactions  . Lactose Intolerance (Gi)       Past Medical History: Past Medical History:  Diagnosis Date  . Bipolar disorder ([place])   . Dysplastic nevus 03/30/2019   Left abdomen, moderate to severa atypia, excision   . GERD (gastroesophageal reflux disease)   . Hypertension      Past Surgical History: Past Surgical History:   Procedure Laterality Date  . COLONOSCOPY    . CYST EXCISION     BACK OF LEG  . CYSTOSCOPY WITH INSERTION OF UROLIFT N/A 11/17/2019   Procedure: CYSTOSCOPY WITH INSERTION OF UROLIFT;  Surgeon: Abbie Sons, MD;  Location: [facility] ORS;  Service: Urology;  Laterality: N/A;  . HERNIA REPAIR  2004 AND 07-2019   [facility]     Family History: History reviewed. No pertinent family history.   Social History: Social History   Socioeconomic History  . Marital status: Married    Spouse name: Not on file  . Number of children: Not on file  . Years of education: Not on file  . Highest education level: Not on file  Occupational History  . Not on file  Tobacco Use  . Smoking status: Never Smoker  . Smokeless tobacco: Never Used  Vaping Use  . Vaping Use: Never used  Substance and Sexual Activity  . Alcohol use: Never  . Drug use: Never  . Sexual activity: Not on file  Other Topics Concern  . Not on file  Social History Narrative  . Not on file   Social Determinants of Health   Financial Resource Strain: Not on file  Food Insecurity: Not on file  Transportation Needs: Not on file  Physical Activity: Not on file  Stress: Not on file  Social Connections: Not on file  Intimate Partner Violence: Not on file     Review of Systems: Gen: Feeling poorly, no fevers or chills HEENT: Dry mouth CV: No chest pain or shortness of breath Resp: No cough or sputum production GI: Poor appetite, reports vomiting, diarrhea GU : No problems reported with voiding.  No previous renal history MS: No complaints Derm:    No complaints Psych: No complaints Heme: No complaints Neuro: No complaints Endocrine.  No complaints  Vital Signs: Blood pressure (!) 141/78, pulse 60, temperature 98.2 F (36.8 C), resp. rate 14, height 5\' 8"  (1.727 m), weight 72.6 kg, SpO2 99 %.  No intake or output data in the 24 hours ending 04/21/20 1215  Weight trends: Filed  Weights   04/20/20 2026  Weight: 72.6 kg    Physical Exam: General:  Frail, elderly gentleman,  laying in the bed  HEENT  dry mouth  Neck:  Supple, no JVD, no masses  Lungs:  Normal breathing on room air, clear to auscultation  Heart::  No rub  Abdomen:  Soft, nontender  Extremities:  No edema  Neurologic:  Alert, able to answer simple question  Skin:  Decreased turgor, no rashes    Lab results: Basic Metabolic Panel: Recent Labs  Lab 04/16/20 1240 04/20/20 2032  NA 142 142  K 4.3 4.2  CL 107 111  CO2 23 22  GLUCOSE 157* 134*  BUN 53* 60*  CREATININE 1.47* 1.64*  CALCIUM 10.5* 11.1*    Liver Function Tests: Recent Labs  Lab 04/20/20 2032  AST 17  ALT 17  ALKPHOS 85  BILITOT 1.3*  PROT 7.7  ALBUMIN 4.5   Recent Labs  Lab 04/20/20 2032  LIPASE 29   No results for input(s): AMMONIA in the last 168 hours.  CBC: Recent Labs  Lab 04/16/20 1211 04/20/20 2032  WBC 10.3 14.6*  HGB 16.3 16.6  HCT 50.5 52.4*  MCV 91.3 92.6  PLT 212 156    Cardiac Enzymes: No results for input(s): CKTOTAL, TROPONINI in the last 168 hours.  BNP: Invalid input(s): POCBNP  CBG: No results for input(s): GLUCAP in the last 168 hours.  Microbiology: Recent Results (from the past 720 hour(s))  Novel Coronavirus, NAA ([facility])     Status: Abnormal   Collection Time: 04/04/20 11:44 AM   Specimen: Nasopharyngeal(NP) swabs in vial transport medium   Nasopharynge  Screenin  Result Value Ref Range Status   SARS-CoV-2, NAA Detected (A) Not Detected Final    Comment: Patients who have a positive COVID-19 test result may now have treatment options. Treatment options are available for patients with mild to moderate symptoms and for hospitalized patients. Visit our website at [email or web address] for resources and information. This nucleic acid amplification test was developed and its performance characteristics determined by [employer]. Nucleic  acid amplification tests include RT-PCR and TMA. This test has not been FDA cleared or approved. This test has been authorized by FDA under an Emergency Use Authorization (EUA). This test is only authorized for the duration of time the declaration that circumstances exist justifying the authorization of the emergency use of in vitro diagnostic tests for detection of SARS-CoV-2 virus and/or diagnosis of COVID-19 infection under section 564(b)(1) of the Act, 21 U.S.C. 867EHM-0(N) (1), unless the authorization is terminated or revoked sooner. When diagnostic testing is negativ e, the possibility of a false negative result should be considered in the context of a patient's recent exposures and the presence of clinical signs and symptoms consistent with COVID-19. An individual without symptoms of COVID-19 and who is not shedding SARS-CoV-2 virus would expect to have a negative (not detected) result in this assay.   SARS-COV-2, NAA 2 DAY TAT     Status: None   Collection Time: 04/04/20 11:44 AM   Nasopharynge  Screenin  Result Value Ref Range Status   SARS-CoV-2, NAA 2 DAY TAT Performed  Final     Coagulation Studies: No results for input(s): LABPROT, INR in the last 72 hours.  Urinalysis: No results for input(s): COLORURINE, LABSPEC, PHURINE, GLUCOSEU, HGBUR, BILIRUBINUR, KETONESUR, PROTEINUR, UROBILINOGEN, NITRITE, LEUKOCYTESUR in the last 72 hours.  Invalid input(s): APPERANCEUR      Imaging: CT ABDOMEN PELVIS W CONTRAST  Result Date: 04/21/2020 CLINICAL DATA:  Multiple episodes of vomiting and diarrhea, intermittent shortness of breath, diagnosed with COVID 19 3 weeks prior  recently evaluated but unsure why EXAM: CT ABDOMEN AND PELVIS WITH CONTRAST TECHNIQUE: Multidetector CT imaging of the abdomen and pelvis was performed using the standard protocol following bolus administration of intravenous contrast. CONTRAST:  26mL OMNIPAQUE IOHEXOL 300 MG/ML  SOLN COMPARISON:  None.  FINDINGS: Lower chest: Some dependent ground-glass in the right lung base, likely atelectatic. Included lung bases are otherwise clear. Normal heart size. No pericardial effusion. Coronary artery calcifications are present. Hepatobiliary: No worrisome focal liver lesions. Smooth liver surface contour. Normal hepatic attenuation. Normal gallbladder and biliary tree without visible calcified gallstone. Pancreas: Partial fatty replacement of the pancreas. No pancreatic ductal dilatation or surrounding inflammatory changes. Spleen: Normal in size. No concerning splenic lesions. Adrenals/Urinary Tract: Mild lobular thickening of the adrenal glands without concerning dominant nodule can reflect some senescent adrenal hyperplasia. Kidneys are normally located with symmetric enhancement and excretion. A 1.4 cm fluid attenuation parapelvic cyst in the lower pole kidney. No suspicious renal lesion, urolithiasis or hydronephrosis. Circumferential bladder wall thickening. May reflect mild trabeculation with indentation of the bladder base by an enlarged prostate. Stomach/Bowel: Distal esophagus, stomach and duodenum are unremarkable. While the proximal bowel is fairly unremarkable. Portions of the terminal ileum in much of the colon demonstrates some very mild mural thickening and mucosal hyperemia. No evidence of obstruction. Normal appendix in the right lower quadrant coursing along the right pelvic sidewall. Vascular/Lymphatic: Atherosclerotic calcifications within the abdominal aorta and branch vessels. No aneurysm or ectasia. No enlarged abdominopelvic lymph nodes. Reproductive: Enlarged prostate with some irregular median lobe hypertrophy. Brachytherapy implants are noted. Few coarse prostate calcifications noted posteriorly. Other: No abdominopelvic free fluid or free gas. No bowel containing hernias. Postsurgical changes in the bilateral inguinal soft tissues including a left groin hernia mesh. Musculoskeletal:  Multilevel degenerative changes are present in the imaged portions of the spine. No acute osseous abnormality or suspicious osseous lesion. IMPRESSION: 1. Portions of the terminal ileum and much of the colon demonstrate some very mild mural thickening and mucosal hyperemia, suggestive of a mild infectious or inflammatory enterocolitis. No evidence of obstruction. 2. Circumferential bladder wall thickening in the setting of some irregular median lobe hypertrophy of the prostate. May reflect mild trabeculation as sequela of chronic outlet obstruction though should correlate with urinalysis to exclude superimposed cystitis. 3. Aortic Atherosclerosis (ICD10-I70.0). Electronically Signed   By: Lovena Le M.D.   On: 04/21/2020 02:22      Assessment & Plan: Pt is a 75 y.o.   male with bipolar disorder on lithium, hypertension, GERD, recent Covid infection,, was admitted on 04/20/2020 with Gastroenteritis [K52.9] Lithium toxicity [T56.891A] Generalized weakness [R53.1] Nausea vomiting and diarrhea [R11.2, R19.7] Lithium toxicity, accidental or unintentional, initial encounter [T56.891A]   #Acute kidney injury Baseline creatinine of 1.0 from December 17, 2019 No recent urinalysis Imaging: CT abdomen with IV contrast-findings suggestive of mild infectious inflammatory enterocolitis, hypertrophy of prostrate, aortic atherosclerosis, left renal cyst.  No hydronephrosis.  Circumferential bladder wall thickening is noted. No intake/output data recorded.   Lab Results  Component Value Date   CREATININE 1.64 (H) 04/20/2020   CREATININE 1.47 (H) 04/16/2020    Agree with aggressive IV hydration.  Currently getting normal saline at 125 cc/h  #Hypercalcemia Likely due to use of lithium and acute kidney injury Follow trends.  Assess response to IV hydration  #Elevated lithium levels No neurotoxicity Monitor closely.    LOS: 0 Hallel Denherder 2/24/202212:15 PM    Note: This note was prepared with  Dragon dictation. Any transcription  errors are unintentional

## 2020-04-21 NOTE — Progress Notes (Signed)
[place] [facility] [place] NAME: Zachary Gardner    MR#:  [id number]  DATE OF BIRTH:  1946-01-18  SUBJECTIVE:   Came in with increasing nausea vomiting diarrhea. Recently admitted for COVID infection. Patient denies shortness of breath. Per RN stool sample yet to be obtained. REVIEW OF SYSTEMS:   Review of Systems  Unable to perform ROS: Medical condition   Tolerating Diet: Tolerating PT:   DRUG ALLERGIES:   Allergies  Allergen Reactions  . Lactose Intolerance (Gi)     VITALS:  Blood pressure (!) 141/78, pulse 60, temperature 98.2 F (36.8 C), resp. rate 14, height 5\' 8"  (1.727 m), weight 72.6 kg, SpO2 99 %.  PHYSICAL EXAMINATION:   Physical Exam  GENERAL:  75 y.o.-year-old patient lying in the bed with no acute distress.  LUNGS: Normal breath sounds bilaterally, no wheezing, rales, rhonchi. No use of accessory muscles of respiration.  CARDIOVASCULAR: S1, S2 normal. No murmurs, rubs, or gallops.  ABDOMEN: Soft, nontender, nondistended. Bowel sounds present. No organomegaly or mass.  EXTREMITIES: No cyanosis, clubbing or edema b/l.    NEUROLOGIC:nonfocal  PSYCHIATRIC:  patient is alert and awake some baseline confusion  SKIN: No obvious rash, lesion, or ulcer.   LABORATORY PANEL:  CBC Recent Labs  Lab 04/20/20 2032  WBC 14.6*  HGB 16.6  HCT 52.4*  PLT 156    Chemistries  Recent Labs  Lab 04/20/20 2032  NA 142  K 4.2  CL 111  CO2 22  GLUCOSE 134*  BUN 60*  CREATININE 1.64*  CALCIUM 11.1*  AST 17  ALT 17  ALKPHOS 85  BILITOT 1.3*   Cardiac Enzymes No results for input(s): TROPONINI in the last 168 hours. RADIOLOGY:  CT ABDOMEN PELVIS W CONTRAST  Result Date: 04/21/2020 CLINICAL DATA:  Multiple episodes of vomiting and diarrhea, intermittent shortness of breath, diagnosed with COVID 19 3 weeks prior recently evaluated but unsure why EXAM: CT ABDOMEN AND PELVIS WITH CONTRAST TECHNIQUE: Multidetector CT imaging of  the abdomen and pelvis was performed using the standard protocol following bolus administration of intravenous contrast. CONTRAST:  8mL OMNIPAQUE IOHEXOL 300 MG/ML  SOLN COMPARISON:  None. FINDINGS: Lower chest: Some dependent ground-glass in the right lung base, likely atelectatic. Included lung bases are otherwise clear. Normal heart size. No pericardial effusion. Coronary artery calcifications are present. Hepatobiliary: No worrisome focal liver lesions. Smooth liver surface contour. Normal hepatic attenuation. Normal gallbladder and biliary tree without visible calcified gallstone. Pancreas: Partial fatty replacement of the pancreas. No pancreatic ductal dilatation or surrounding inflammatory changes. Spleen: Normal in size. No concerning splenic lesions. Adrenals/Urinary Tract: Mild lobular thickening of the adrenal glands without concerning dominant nodule can reflect some senescent adrenal hyperplasia. Kidneys are normally located with symmetric enhancement and excretion. A 1.4 cm fluid attenuation parapelvic cyst in the lower pole kidney. No suspicious renal lesion, urolithiasis or hydronephrosis. Circumferential bladder wall thickening. May reflect mild trabeculation with indentation of the bladder base by an enlarged prostate. Stomach/Bowel: Distal esophagus, stomach and duodenum are unremarkable. While the proximal bowel is fairly unremarkable. Portions of the terminal ileum in much of the colon demonstrates some very mild mural thickening and mucosal hyperemia. No evidence of obstruction. Normal appendix in the right lower quadrant coursing along the right pelvic sidewall. Vascular/Lymphatic: Atherosclerotic calcifications within the abdominal aorta and branch vessels. No aneurysm or ectasia. No enlarged abdominopelvic lymph nodes. Reproductive: Enlarged prostate with some irregular median lobe hypertrophy. Brachytherapy implants are noted. Few  coarse prostate calcifications noted posteriorly. Other: No  abdominopelvic free fluid or free gas. No bowel containing hernias. Postsurgical changes in the bilateral inguinal soft tissues including a left groin hernia mesh. Musculoskeletal: Multilevel degenerative changes are present in the imaged portions of the spine. No acute osseous abnormality or suspicious osseous lesion. IMPRESSION: 1. Portions of the terminal ileum and much of the colon demonstrate some very mild mural thickening and mucosal hyperemia, suggestive of a mild infectious or inflammatory enterocolitis. No evidence of obstruction. 2. Circumferential bladder wall thickening in the setting of some irregular median lobe hypertrophy of the prostate. May reflect mild trabeculation as sequela of chronic outlet obstruction though should correlate with urinalysis to exclude superimposed cystitis. 3. Aortic Atherosclerosis (ICD10-I70.0). Electronically Signed   By: Lovena Le M.D.   On: 04/21/2020 02:22   ASSESSMENT AND PLAN:  75 year old male with bipolar mood disorder on lithium, HTN and GERD, with COVID-19 infection with mild symptoms 3 weeks prior with respiratory symptoms presenting with a 1 week complaint of vomiting, diarrhea, protracted nausea with decreased appetite and generalized malaise that started several days after his recovery from his respiratory Covid symptoms.    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    AKI (acute kidney injury) ([place])  -Believed prerenal and secondary to gastrointestinal losses more so than to lithium toxicity -Hydrate and monitor renal function -Nephrology consult  appreciated -came in with Creat 1.47--1.64 -baseline creat 1.0 (October 2021)    Enterocolitis with history of COVID-19 -Possibly related to COVID-19 from 3 weeks prior -States vomiting and diarrhea started 1 week ago after recovery of his respiratory symptoms -CT abdomen and pelvis consistent with enterocolitis -Continue IV hydration -Follow GI panel--pt has not been able to give stool sample yet -Clear liquid diet advance as tolerated -Supportive care    Generalized weakness -Secondary to enterocolitis with dehydration and lithium toxicity -Treat acute conditions as outlined above -Can consider PT consult.    DVT prophylaxis: Lovenox  Code Status: full code  Family Communication:  none  Disposition Plan: Back to previous home environment Consults called: none  Status: inpatient Remains inpatient appropriate because:Inpatient level of care appropriate due to severity of illness   Dispo: The patient is from: Home              Anticipated d/c is to: Home              Patient currently is not medically stable to d/c.   Difficult to place patient No  Continue monitor renal function and lithium levels.      TOTAL TIME TAKING CARE OF THIS PATIENT: 25 minutes.  >50% time spent on counselling and coordination of care  Note: This dictation was prepared with Dragon dictation along with smaller phrase technology. Any transcriptional errors that result from this process are unintentional.  Fritzi Mandes M.D    [facility]   CC: Primary care physician; Derinda Late, MDPatient ID: Shanon Brow, male   DOB: Jan 12, 1946, 75 y.o.   MRN: [id number]

## 2020-04-21 NOTE — ED Notes (Signed)
Critical value of 2.1 lithium level reports from lab. Tamala Julian MD notified.

## 2020-04-22 DIAGNOSIS — R531 Weakness: Secondary | ICD-10-CM

## 2020-04-22 DIAGNOSIS — N179 Acute kidney failure, unspecified: Principal | ICD-10-CM

## 2020-04-22 DIAGNOSIS — T56891A Toxic effect of other metals, accidental (unintentional), initial encounter: Secondary | ICD-10-CM

## 2020-04-22 DIAGNOSIS — K529 Noninfective gastroenteritis and colitis, unspecified: Secondary | ICD-10-CM

## 2020-04-22 LAB — URINALYSIS, COMPLETE (UACMP) WITH MICROSCOPIC
Bacteria, UA: NONE SEEN
Bilirubin Urine: NEGATIVE
Glucose, UA: NEGATIVE mg/dL
Hgb urine dipstick: NEGATIVE
Ketones, ur: NEGATIVE mg/dL
Leukocytes,Ua: NEGATIVE
Nitrite: NEGATIVE
Protein, ur: NEGATIVE mg/dL
Specific Gravity, Urine: 1.021 (ref 1.005–1.030)
Squamous Epithelial / HPF: NONE SEEN (ref 0–5)
pH: 6 (ref 5.0–8.0)

## 2020-04-22 LAB — LITHIUM LEVEL: Lithium Lvl: 1.31 mmol/L — ABNORMAL HIGH (ref 0.60–1.20)

## 2020-04-22 LAB — BASIC METABOLIC PANEL
Anion gap: 5 (ref 5–15)
BUN: 36 mg/dL — ABNORMAL HIGH (ref 8–23)
CO2: 22 mmol/L (ref 22–32)
Calcium: 9.9 mg/dL (ref 8.9–10.3)
Chloride: 120 mmol/L — ABNORMAL HIGH (ref 98–111)
Creatinine, Ser: 1.16 mg/dL (ref 0.61–1.24)
GFR, Estimated: >60 mL/min (ref >60)
Glucose, Bld: 125 mg/dL — ABNORMAL HIGH (ref 70–99)
Potassium: 4.3 mmol/L (ref 3.5–5.1)
Sodium: 147 mmol/L — ABNORMAL HIGH (ref 135–145)

## 2020-04-22 MED ORDER — ADULT MULTIVITAMIN W/MINERALS CH
1.0000 | ORAL_TABLET | Freq: Every day | ORAL | Status: DC
  Administered 2020-04-23 – 2020-04-26 (×4): 1 via ORAL
  Filled 2020-04-22 (×4): qty 1

## 2020-04-22 MED ORDER — TERAZOSIN HCL 2 MG PO CAPS
2.0000 mg | ORAL_CAPSULE | Freq: Every day | ORAL | Status: DC
  Administered 2020-04-22 – 2020-04-24 (×3): 2 mg via ORAL
  Filled 2020-04-22 (×4): qty 1

## 2020-04-22 MED ORDER — SIMVASTATIN 40 MG PO TABS
40.0000 mg | ORAL_TABLET | Freq: Every day | ORAL | Status: DC
  Administered 2020-04-22 – 2020-04-25 (×4): 40 mg via ORAL
  Filled 2020-04-22 (×4): qty 1
  Filled 2020-04-22: qty 2

## 2020-04-22 MED ORDER — VITAMIN D 25 MCG (1000 UNIT) PO TABS
1000.0000 [IU] | ORAL_TABLET | Freq: Every day | ORAL | Status: DC
  Administered 2020-04-23 – 2020-04-26 (×4): 1000 [IU] via ORAL
  Filled 2020-04-22 (×4): qty 1

## 2020-04-22 MED ORDER — VITAMIN B-12 1000 MCG PO TABS
1000.0000 ug | ORAL_TABLET | Freq: Every day | ORAL | Status: DC
  Administered 2020-04-23 – 2020-04-26 (×4): 1000 ug via ORAL
  Filled 2020-04-22 (×4): qty 1

## 2020-04-22 MED ORDER — FINASTERIDE 5 MG PO TABS
5.0000 mg | ORAL_TABLET | Freq: Every day | ORAL | Status: DC
  Administered 2020-04-22 – 2020-04-24 (×3): 5 mg via ORAL
  Filled 2020-04-22 (×3): qty 1

## 2020-04-22 MED ORDER — ENSURE ENLIVE PO LIQD
237.0000 mL | Freq: Three times a day (TID) | ORAL | Status: DC
  Administered 2020-04-22: 237 mL via ORAL

## 2020-04-22 MED ORDER — SODIUM CHLORIDE 0.45 % IV SOLN: INTRAVENOUS | Status: DC

## 2020-04-22 MED ORDER — TAMSULOSIN HCL 0.4 MG PO CAPS
0.4000 mg | ORAL_CAPSULE | Freq: Every day | ORAL | Status: DC
  Administered 2020-04-22 – 2020-04-25 (×4): 0.4 mg via ORAL
  Filled 2020-04-22 (×4): qty 1

## 2020-04-22 NOTE — Progress Notes (Signed)
[name]    MR#:  [id number]  DATE OF BIRTH:  1945-02-28  SUBJECTIVE:   Came in with increasing nausea vomiting diarrhea. Recently admitted for COVID infection.   Patient sitting up in the bed however room keeps his eyes closed most of the time. Does follow commands. Having issues with short-term memory. Able to tell me his name, birth month and date. Cannot remember the ER.In [place]. Wife is at bedside  Patient's wife reports since COVID infection he has not had good PO intake. He is been having issues with swallowing where it hurts him and takes longer to swallow. He had appointment with the ENT but canceled due to patient being in the hospital REVIEW OF SYSTEMS:   Review of Systems  Unable to perform ROS: Medical condition   Tolerating Diet:some Tolerating PT: pending  DRUG ALLERGIES:   Allergies  Allergen Reactions  . Lactose Intolerance (Gi)     VITALS:  Blood pressure (!) 149/84, pulse 65, temperature 98.5 F (36.9 C), resp. rate 19, height 5\' 8"  (1.727 m), weight 60.5 kg, SpO2 100 %.  PHYSICAL EXAMINATION:   Physical Exam  GENERAL:  75 y.o.-year-old patient lying in the bed with no acute distress. weAK LUNGS: Normal breath sounds bilaterally, no wheezing, rales, rhonchi. No use of accessory muscles of respiration.  CARDIOVASCULAR: S1, S2 normal. No murmurs, rubs, or gallops.  ABDOMEN: Soft, nontender, nondistended. Bowel sounds present. No organomegaly or mass.  EXTREMITIES: No cyanosis, clubbing or edema b/l.    NEUROLOGIC:nonfocal  PSYCHIATRIC:  patient is alert and awake some baseline confusion  SKIN: No obvious rash, lesion, or ulcer.   LABORATORY PANEL:  CBC Recent Labs  Lab 04/20/20 2032  WBC 14.6*  HGB 16.6  HCT 52.4*  PLT 156    Chemistries  Recent Labs  Lab 04/20/20 2032 04/22/20 0523  NA 142 147*  K 4.2 4.3  CL 111 120*  CO2 22 22  GLUCOSE 134*  125*  BUN 60* 36*  CREATININE 1.64* 1.16  CALCIUM 11.1* 9.9  AST 17  --   ALT 17  --   ALKPHOS 85  --   BILITOT 1.3*  --    Cardiac Enzymes No results for input(s): TROPONINI in the last 168 hours. RADIOLOGY:  CT ABDOMEN PELVIS W CONTRAST  Result Date: 04/21/2020 CLINICAL DATA:  Multiple episodes of vomiting and diarrhea, intermittent shortness of breath, diagnosed with COVID 19 3 weeks prior recently evaluated but unsure why EXAM: CT ABDOMEN AND PELVIS WITH CONTRAST TECHNIQUE: Multidetector CT imaging of the abdomen and pelvis was performed using the standard protocol following bolus administration of intravenous contrast. CONTRAST:  47mL OMNIPAQUE IOHEXOL 300 MG/ML  SOLN COMPARISON:  None. FINDINGS: Lower chest: Some dependent ground-glass in the right lung base, likely atelectatic. Included lung bases are otherwise clear. Normal heart size. No pericardial effusion. Coronary artery calcifications are present. Hepatobiliary: No worrisome focal liver lesions. Smooth liver surface contour. Normal hepatic attenuation. Normal gallbladder and biliary tree without visible calcified gallstone. Pancreas: Partial fatty replacement of the pancreas. No pancreatic ductal dilatation or surrounding inflammatory changes. Spleen: Normal in size. No concerning splenic lesions. Adrenals/Urinary Tract: Mild lobular thickening of the adrenal glands without concerning dominant nodule can reflect some senescent adrenal hyperplasia. Kidneys are normally located with symmetric enhancement and excretion. A 1.4 cm fluid attenuation parapelvic cyst in the lower pole kidney. No suspicious renal lesion, urolithiasis or hydronephrosis. Circumferential  bladder wall thickening. May reflect mild trabeculation with indentation of the bladder base by an enlarged prostate. Stomach/Bowel: Distal esophagus, stomach and duodenum are unremarkable. While the proximal bowel is fairly unremarkable. Portions of the terminal ileum in much of the  colon demonstrates some very mild mural thickening and mucosal hyperemia. No evidence of obstruction. Normal appendix in the right lower quadrant coursing along the right pelvic sidewall. Vascular/Lymphatic: Atherosclerotic calcifications within the abdominal aorta and branch vessels. No aneurysm or ectasia. No enlarged abdominopelvic lymph nodes. Reproductive: Enlarged prostate with some irregular median lobe hypertrophy. Brachytherapy implants are noted. Few coarse prostate calcifications noted posteriorly. Other: No abdominopelvic free fluid or free gas. No bowel containing hernias. Postsurgical changes in the bilateral inguinal soft tissues including a left groin hernia mesh. Musculoskeletal: Multilevel degenerative changes are present in the imaged portions of the spine. No acute osseous abnormality or suspicious osseous lesion. IMPRESSION: 1. Portions of the terminal ileum and much of the colon demonstrate some very mild mural thickening and mucosal hyperemia, suggestive of a mild infectious or inflammatory enterocolitis. No evidence of obstruction. 2. Circumferential bladder wall thickening in the setting of some irregular median lobe hypertrophy of the prostate. May reflect mild trabeculation as sequela of chronic outlet obstruction though should correlate with urinalysis to exclude superimposed cystitis. 3. Aortic Atherosclerosis (ICD10-I70.0). Electronically Signed   By: Lovena Le M.D.   On: 04/21/2020 02:22   ASSESSMENT AND PLAN:  75 year old male with bipolar mood disorder on lithium, HTN and GERD, with COVID-19 infection with mild symptoms 3 weeks prior with respiratory symptoms presenting with a 1 week complaint of vomiting, diarrhea, protracted nausea with decreased appetite and generalized malaise that started several days after his recovery from his respiratory Covid symptoms.  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   Bipolar mood disorder -Lithium level elevated at 2.1--1.3 while taking lithium therapeutically but patient without neurologic symptoms --likely related to fluid losses with resulting dehydration from gastrointestinal illness -Continue IV hydration -Continue to monitor renal function -Hold home lithium TODAY    Enterocolitis with history of COVID-19 -Possibly related to COVID-19 from 3 weeks prior -States vomiting and diarrhea started 1 week ago after recovery of his respiratory symptoms -CT abdomen and pelvis consistent with enterocolitis -Continue IV hydration -Follow GI panel--pt has not been able to give stool sample yet -Clear liquid diet advance as tolerated -Supportive care    Generalized weakness -Secondary to enterocolitis with dehydration and lithium toxicity -Treat acute conditions as outlined above - consider PT consult.  Dysphagia -- wife reports patient having difficulty swallowing. He has this going on for several months. Had appointment with ENT unable to make it since patient was not doing well. -- Will have speech therapy see patient.    DVT prophylaxis: Lovenox  Code Status: full code  Family Communication:   wife at bedside Disposition Plan: Back to previous home environment Consults called: none  Status: inpatient Remains inpatient appropriate because:Inpatient level of care appropriate due to severity of illness   Dispo: The patient is from: Home              Anticipated d/c is to: Home              Patient currently is not medically stable to  d/c.   Difficult to place patient No  Continue monitor renal function and lithium levels. Speech therapy for dysphagia.     TOTAL TIME TAKING CARE OF THIS PATIENT: 25 minutes.  >50% time spent on counselling  and coordination of care  Note: This dictation was prepared with Dragon dictation along with smaller phrase technology. Any transcriptional errors that result from this process are unintentional.  Fritzi Mandes M.D    [facility]   CC: Primary care physician; Derinda Late, MDPatient ID: Zachary Gardner, male   DOB: Mar 30, 1945, 75 y.o.   MRN: [id number]

## 2020-04-22 NOTE — Progress Notes (Signed)
Initial Nutrition Assessment  DOCUMENTATION CODES:   Severe malnutrition in context of social or environmental circumstances  INTERVENTION:  Provide Ensure Enlive po TID, each supplement provides 350 kcal and 20 grams of protein.  Provide Magic cup TID with meals, each supplement provides 290 kcal and 9 grams of protein.  Provide MVI po daily.  NUTRITION DIAGNOSIS:   Severe Malnutrition related to social / environmental circumstances (inadequate oral intake, also complicated by catabolic nature of recent COVID-19 infection) as evidenced by severe fat depletion,moderate muscle depletion,severe muscle depletion, 12.2% weight loss over 4 months.  GOAL:   Patient will meet greater than or equal to 90% of their needs  MONITOR:   PO intake,Supplement acceptance,Diet advancement,Weight trends,TF tolerance,I & O's,Labs  REASON FOR ASSESSMENT:   Malnutrition Screening Tool    ASSESSMENT:   75 year old male with PMHx of HTN, GERD, bipolar disorder on lithium, recent COVID-19 infection 04/04/2020 admitted with lithium toxicity, AKI, enterocolitis, generalized weakness.   Met with patient and wife at bedside this morning. Wife provided most of history for patient. She reports patient has had a decreased appetite for a while now but it worsened several weeks ago after diagnosis of COVID. She reports patient has been eating small amounts at meals such as one piece of toast, or one waffle for breakfast, and 1/2 sandwich for lunch. He is eating smaller amounts at dinner and occasionally will not eat dinner. She reports patient has been chewing solid food for long periods of time and has to be reminded to swallow. He occasionally pockets food. He was on liquid diet at time of RD assessment. Wife reports patient would likely try Ensure and Magic Cup to help meet estimated needs. She reports patient is lactose-intolerant but will eat ice cream and other lactose-containing foods and take a Lactaid pill  with them.   Patient's wife reports his UBW was around 158 lbs and that he has been losing weight for a while now. Per chart patient was 68.9 kg on 12/24/2019. RD obtained bed scale weight today of 60.5 kg (133.38 lbs). That is a weight loss of 8.4 kg (12.2% body weight) over the past 4 months, which is significant for time frame.  Medications reviewed and include: vitamin D3 1000 units daily, vitamin B12 1000 micrograms daily, 1/2 NS at 100 mL/hr.  Labs reviewed: Sodium 147, Chloride 120, BUN 36.  Sent secure chat to MD regarding patient's report of chewing for long periods of time and pocketing and recommendation to consider SLP consult. Also sent in message how patient takes Lactaid pill with meals.  NUTRITION - FOCUSED PHYSICAL EXAM:  Flowsheet Row Most Recent Value  Orbital Region Severe depletion  Upper Arm Region Severe depletion  Thoracic and Lumbar Region Moderate depletion  Buccal Region Severe depletion  Temple Region Severe depletion  Clavicle Bone Region Severe depletion  Clavicle and Acromion Bone Region Severe depletion  Scapular Bone Region Moderate depletion  Dorsal Hand Severe depletion  Patellar Region Severe depletion  Anterior Thigh Region Moderate depletion  Posterior Calf Region Severe depletion  Edema (RD Assessment) None  Hair Reviewed  Eyes Reviewed  Mouth Reviewed  Skin Reviewed  Nails Reviewed     Diet Order:   Diet Order            DIET SOFT Room service appropriate? Yes; Fluid consistency: Thin  Diet effective now                EDUCATION NEEDS:   No education  needs have been identified at this time  Skin:  Skin Assessment: Reviewed RN Assessment  Last BM:  04/22/2020 - large type 4  Height:   Ht Readings from Last 1 Encounters:  04/20/20 _0  (1.727 m)   Weight:   Wt Readings from Last 1 Encounters:  04/22/20 60.5 kg   BMI:  Body mass index is 20.28 kg/m.  Estimated Nutritional Needs:   Kcal:  1700-1900  Protein:  85-95  grams  Fluid:  1.7-1.9 L/day  Jacklynn Barnacle, MS, RD, LDN Pager number available on Amion

## 2020-04-22 NOTE — Progress Notes (Signed)
[place], [place] 04/22/20  Subjective:   LOS: 1 Zachary Gardner is a 75 y.o. male with PMHX of bipolar mood disorder on lithium, HTN, GERD, and Covid 19 with mild symptoms. He presented to the ED with vomiting, diarrhea, decreased appetite and malaise for 1 week.   He is admitted with Lithium toxicity from chromic use  We have been consulted for an increased creatinine levels at admission. Also noted to have hypercalcemia, elevated lithium levels and dehydration.   Patient seen today resting in bed Alert and disoriented time Currently on room air Denies shortness of breath Liquid breakfast tray with patient  He has only drank 2 juices Denies chest pain\  Seen later with wife at the bedside States he able to drive, garden, maintain chores 3 weeks ago. Says once he was diagnosed with Covid-19, he "just went down." Says his appetite has been poor since that time    Objective:  Vital signs in last 24 hours:  Temp:  [97.5 F (36.4 C)-98.1 F (36.7 C)] 97.7 F (36.5 C) (02/25 0750) Pulse Rate:  [53-85] 62 (02/25 0750) Resp:  [14-20] 14 (02/25 0750) BP: (129-162)/(66-90) 135/77 (02/25 0750) SpO2:  [97 %-100 %] 97 % (02/25 0750)  Weight change:  Filed Weights   04/20/20 2026  Weight: 72.6 kg    Intake/Output:   No intake or output data in the 24 hours ending 04/22/20 1027   Physical Exam: General: No acute distress, laying in bed  HEENT Dry oral mucosa  Pulm/lungs Clear, normal breathing  CVS/Heart Regular rate   Abdomen:  soft  Extremities: No peripheral edema  Neurologic: Alert, able to answer simple questions  Skin: No rashes or masses          Basic Metabolic Panel:  Recent Labs  Lab 04/16/20 1240 04/20/20 2032 04/22/20 0523  NA 142 142 147*  K 4.3 4.2 4.3  CL 107 111 120*  CO2 23 22 22   GLUCOSE 157* 134* 125*  BUN 53* 60* 36*  CREATININE 1.47* 1.64* 1.16  CALCIUM 10.5* 11.1* 9.9     CBC: Recent Labs  Lab  04/16/20 1211 04/20/20 2032  WBC 10.3 14.6*  HGB 16.3 16.6  HCT 50.5 52.4*  MCV 91.3 92.6  PLT 212 156     No results found for: HEPBSAG, HEPBSAB, HEPBIGM    Microbiology:  No results found for this or any previous visit (from the past 240 hour(s)).  Coagulation Studies: No results for input(s): LABPROT, INR in the last 72 hours.  Urinalysis: Recent Labs    04/21/20 2300  COLORURINE YELLOW*  LABSPEC 1.021  PHURINE 6.0  GLUCOSEU NEGATIVE  HGBUR NEGATIVE  BILIRUBINUR NEGATIVE  KETONESUR NEGATIVE  PROTEINUR NEGATIVE  NITRITE NEGATIVE  LEUKOCYTESUR NEGATIVE      Imaging: CT ABDOMEN PELVIS W CONTRAST  Result Date: 04/21/2020 CLINICAL DATA:  Multiple episodes of vomiting and diarrhea, intermittent shortness of breath, diagnosed with COVID 19 3 weeks prior recently evaluated but unsure why EXAM: CT ABDOMEN AND PELVIS WITH CONTRAST TECHNIQUE: Multidetector CT imaging of the abdomen and pelvis was performed using the standard protocol following bolus administration of intravenous contrast. CONTRAST:  48mL OMNIPAQUE IOHEXOL 300 MG/ML  SOLN COMPARISON:  None. FINDINGS: Lower chest: Some dependent ground-glass in the right lung base, likely atelectatic. Included lung bases are otherwise clear. Normal heart size. No pericardial effusion. Coronary artery calcifications are present. Hepatobiliary: No worrisome focal liver lesions. Smooth liver surface contour. Normal hepatic attenuation. Normal gallbladder and biliary  tree without visible calcified gallstone. Pancreas: Partial fatty replacement of the pancreas. No pancreatic ductal dilatation or surrounding inflammatory changes. Spleen: Normal in size. No concerning splenic lesions. Adrenals/Urinary Tract: Mild lobular thickening of the adrenal glands without concerning dominant nodule can reflect some senescent adrenal hyperplasia. Kidneys are normally located with symmetric enhancement and excretion. A 1.4 cm fluid attenuation parapelvic  cyst in the lower pole kidney. No suspicious renal lesion, urolithiasis or hydronephrosis. Circumferential bladder wall thickening. May reflect mild trabeculation with indentation of the bladder base by an enlarged prostate. Stomach/Bowel: Distal esophagus, stomach and duodenum are unremarkable. While the proximal bowel is fairly unremarkable. Portions of the terminal ileum in much of the colon demonstrates some very mild mural thickening and mucosal hyperemia. No evidence of obstruction. Normal appendix in the right lower quadrant coursing along the right pelvic sidewall. Vascular/Lymphatic: Atherosclerotic calcifications within the abdominal aorta and branch vessels. No aneurysm or ectasia. No enlarged abdominopelvic lymph nodes. Reproductive: Enlarged prostate with some irregular median lobe hypertrophy. Brachytherapy implants are noted. Few coarse prostate calcifications noted posteriorly. Other: No abdominopelvic free fluid or free gas. No bowel containing hernias. Postsurgical changes in the bilateral inguinal soft tissues including a left groin hernia mesh. Musculoskeletal: Multilevel degenerative changes are present in the imaged portions of the spine. No acute osseous abnormality or suspicious osseous lesion. IMPRESSION: 1. Portions of the terminal ileum and much of the colon demonstrate some very mild mural thickening and mucosal hyperemia, suggestive of a mild infectious or inflammatory enterocolitis. No evidence of obstruction. 2. Circumferential bladder wall thickening in the setting of some irregular median lobe hypertrophy of the prostate. May reflect mild trabeculation as sequela of chronic outlet obstruction though should correlate with urinalysis to exclude superimposed cystitis. 3. Aortic Atherosclerosis (ICD10-I70.0). Electronically Signed   By: Lovena Le M.D.   On: 04/21/2020 02:22     Medications:   . sodium chloride 100 mL/hr at 04/22/20 0928   . enoxaparin (LOVENOX) injection  40  mg Subcutaneous Q24H   acetaminophen **OR** acetaminophen, ondansetron **OR** ondansetron (ZOFRAN) IV  Assessment/ Plan:  75 y.o. male with  was admitted on 04/20/2020 for  Principal Problem:   Lithium toxicity Active Problems:   AKI (acute kidney injury) ([facility])   Enterocolitis   Generalized weakness   History of COVID-19   Gastroenteritis  Gastroenteritis [K52.9] Lithium toxicity [T56.891A] Generalized weakness [R53.1] Nausea vomiting and diarrhea [R11.2, R19.7] Lithium toxicity, accidental or unintentional, initial encounter [G95.621H]  #. Acute kidney injury -Baseline Crt 1.0 on Nov 27, 2019 -Admission level 1.64 -Current Crt 1.16 -IVF ordered by primary team -4 urine occurrences noted yesterday -would recommend appetite stimulant, dicussed with primary team -Creatinine improving with hydration -IVF changed to .45% NS at 100 ml/hr -will sign off based on improved Creatinine   #. Hypercalcemia -Likely due to litium toxicity and acute kidney injury -Calcium corrected- 9.9    LOS: [place] 2/25/202210:27 [place], [place]

## 2020-04-22 NOTE — Consult Note (Signed)
[facility] Face-to-Face Psychiatry Consult   Reason for Consult: Consult for this 75 year old man who came to the hospital with lithium toxicity Referring Physician: Posey Pronto Patient Identification: KIRBY CORTESE MRN:  [id number] Principal Diagnosis: Lithium toxicity Diagnosis:  Principal Problem:   Lithium toxicity Active Problems:   AKI (acute kidney injury) ([place])   Enterocolitis   Generalized weakness   History of COVID-19   Gastroenteritis   Total Time spent with patient: 1 hour  Subjective:   DRURY ARDIZZONE is a 75 y.o. male patient admitted with "while I have not been well".  HPI: Patient seen chart reviewed.  Wife also present in the room and gave history.  75 year old man presented to the hospital with weakness nausea unsteadiness.  Found to have lithium level above 2 with acute kidney injury.  After hydration lithium level is coming down and is 1.3 today.  Renal function improving although he continues to have elevated sodium.  Question arisen about restarting lithium.  Patient and wife both contributed to history.  Reports that he has been on the same dose of lithium of 750 mg at night for years.  He receives his psychiatric care through the [facility] hospital in [place].  Patient reports that recently his mood has been feeling down and depressed about being sick.  When asked about suicidal ideation he admits that at times he has had thoughts of wishing he were dead although he denies ever having had any plan to hurt himself.  He has not been able to eat well for probably a couple weeks and his energy level is been very low.  Prior to that however there is no history of severe depression or mania in years.  Cannot even remember the last hospitalization.  No psychosis.  Patient is not able to remember any specifics about prior manic episodes but says that his psychiatrist who knows him well is certain that he has bipolar disorder.  Patient denies having intentionally overdosed on medication.  He had  COVID recently and seems to have lost his appetite and probably had gradually worsening dehydration leading to lithium toxicity  Past Psychiatric History: History of bipolar disorder.  Patient's memory is not very good right now.  Been on this medicine however for years.  Cannot remember the last hospitalization.  He says he has a history of suicidality but it was in his 58s.  Risk to Self:   Risk to Others:   Prior Inpatient Therapy:   Prior Outpatient Therapy:    Past Medical History:  Past Medical History:  Diagnosis Date  . Bipolar disorder ([place])   . Dysplastic nevus 03/30/2019   Left abdomen, moderate to severa atypia, excision   . GERD (gastroesophageal reflux disease)   . Hypertension     Past Surgical History:  Procedure Laterality Date  . COLONOSCOPY    . CYST EXCISION     BACK OF LEG  . CYSTOSCOPY WITH INSERTION OF UROLIFT N/A 11/17/2019   Procedure: CYSTOSCOPY WITH INSERTION OF UROLIFT;  Surgeon: Abbie Sons, MD;  Location: [facility] ORS;  Service: Urology;  Laterality: N/A;  . HERNIA REPAIR  2004 AND 07-2019   [facility]   Family History: History reviewed. No pertinent family history. Family Psychiatric  History: Does not know of any Social History:  Social History   Substance and Sexual Activity  Alcohol Use Never     Social History   Substance and Sexual Activity  Drug Use Never  Social History   Socioeconomic History  . Marital status: Married    Spouse name: Not on file  . Number of children: Not on file  . Years of education: Not on file  . Highest education level: Not on file  Occupational History  . Not on file  Tobacco Use  . Smoking status: Never Smoker  . Smokeless tobacco: Never Used  Vaping Use  . Vaping Use: Never used  Substance and Sexual Activity  . Alcohol use: Never  . Drug use: Never  . Sexual activity: Not on file  Other Topics Concern  . Not on file  Social History Narrative  . Not on file    Social Determinants of Health   Financial Resource Strain: Not on file  Food Insecurity: Not on file  Transportation Needs: Not on file  Physical Activity: Not on file  Stress: Not on file  Social Connections: Not on file   Additional Social History:    Allergies:   Allergies  Allergen Reactions  . Lactose Intolerance (Gi)     Labs:  Results for orders placed or performed during the hospital encounter of 04/20/20 (from the past 48 hour(s))  Lipase, blood     Status: None   Collection Time: 04/20/20  8:32 PM  Result Value Ref Range   Lipase 29 11 - 51 U/L    Comment: Performed at [facility], [address] [place], [place] [address]  Comprehensive metabolic panel     Status: Abnormal   Collection Time: 04/20/20  8:32 PM  Result Value Ref Range   Sodium 142 135 - 145 mmol/L   Potassium 4.2 3.5 - 5.1 mmol/L   Chloride 111 98 - 111 mmol/L   CO2 22 22 - 32 mmol/L   Glucose, Bld 134 (H) 70 - 99 mg/dL    Comment: Glucose reference range applies only to samples taken after fasting for at least 8 hours.   BUN 60 (H) 8 - 23 mg/dL   Creatinine, Ser 1.64 (H) 0.61 - 1.24 mg/dL   Calcium 11.1 (H) 8.9 - 10.3 mg/dL   Total Protein 7.7 6.5 - 8.1 g/dL   Albumin 4.5 3.5 - 5.0 g/dL   AST 17 15 - 41 U/L   ALT 17 0 - 44 U/L   Alkaline Phosphatase 85 38 - 126 U/L   Total Bilirubin 1.3 (H) 0.3 - 1.2 mg/dL   GFR, Estimated 43 (L) >60 mL/min    Comment: (NOTE) Calculated using the CKD-EPI Creatinine Equation (2021)    Anion gap 9 5 - 15    Comment: Performed at [facility], [address] [place], [place] [address]  CBC     Status: Abnormal   Collection Time: 04/20/20  8:32 PM  Result Value Ref Range   WBC 14.6 (H) 4.0 - 10.5 K/uL   RBC 5.66 4.22 - 5.81 MIL/uL   Hemoglobin 16.6 13.0 - 17.0 g/dL   HCT 52.4 (H) 39.0 - 52.0 %   MCV 92.6 80.0 - 100.0 fL   MCH 29.3 26.0 - 34.0 pg   MCHC 31.7 30.0 - 36.0 g/dL   RDW 12.6 11.5 - 15.5 %   Platelets 156 150 - 400  K/uL   nRBC 0.0 0.0 - 0.2 %    Comment: Performed at [facility], [address], [place], [place] [address]  Troponin I (High Sensitivity)     Status: None   Collection Time: 04/20/20  8:32 PM  Result Value Ref  Range   Troponin I (High Sensitivity) 11 <18 ng/L    Comment: (NOTE) Elevated high sensitivity troponin I (hsTnI) values and significant  changes across serial measurements may suggest ACS but many other  chronic and acute conditions are known to elevate hsTnI results.  Refer to the "Links" section for chest pain algorithms and additional  guidance. Performed at [facility], [address] [place], [place] [address]   Lithium level     Status: Abnormal   Collection Time: 04/20/20  8:32 PM  Result Value Ref Range   Lithium Lvl 2.10 (HH) 0.60 - 1.20 mmol/L    Comment: CRITICAL RESULT CALLED TO, READ BACK BY AND VERIFIED WITH KENDALL SHEETS @0239  ON 04/21/20 SKL Performed at [facility], [address] [place], [place] [address]   Lithium level     Status: Abnormal   Collection Time: 04/21/20  6:34 AM  Result Value Ref Range   Lithium Lvl 2.06 (HH) 0.60 - 1.20 mmol/L    Comment: CRITICAL RESULT CALLED TO, READ BACK BY AND VERIFIED WITH ALLY YOW AT 0865 04/21/20 DAS Performed at [facility], [address] [place], [place] [address]   Urinalysis, Complete w Microscopic     Status: Abnormal   Collection Time: 04/21/20 11:00 PM  Result Value Ref Range   Color, Urine YELLOW (A) YELLOW   APPearance CLEAR (A) CLEAR   Specific Gravity, Urine 1.021 1.005 - 1.030   pH 6.0 5.0 - 8.0   Glucose, UA NEGATIVE NEGATIVE mg/dL   Hgb urine dipstick NEGATIVE NEGATIVE   Bilirubin Urine NEGATIVE NEGATIVE   Ketones, ur NEGATIVE NEGATIVE mg/dL   Protein, ur NEGATIVE NEGATIVE mg/dL   Nitrite NEGATIVE NEGATIVE   Leukocytes,Ua NEGATIVE NEGATIVE   RBC / HPF 0-5 0 - 5 RBC/hpf   WBC, UA 0-5 0 - 5 WBC/hpf   Bacteria, UA NONE SEEN NONE SEEN    Squamous Epithelial / LPF NONE SEEN 0 - 5    Comment: Performed at [facility], [address], [place], [place] [address]  Basic metabolic panel     Status: Abnormal   Collection Time: 04/22/20  5:23 AM  Result Value Ref Range   Sodium 147 (H) 135 - 145 mmol/L   Potassium 4.3 3.5 - 5.1 mmol/L   Chloride 120 (H) 98 - 111 mmol/L   CO2 22 22 - 32 mmol/L   Glucose, Bld 125 (H) 70 - 99 mg/dL    Comment: Glucose reference range applies only to samples taken after fasting for at least 8 hours.   BUN 36 (H) 8 - 23 mg/dL   Creatinine, Ser 1.16 0.61 - 1.24 mg/dL   Calcium 9.9 8.9 - 10.3 mg/dL   GFR, Estimated >60 >60 mL/min    Comment: (NOTE) Calculated using the CKD-EPI Creatinine Equation (2021)    Anion gap 5 5 - 15    Comment: Performed at [facility], [address] [place], [place] [address]  Lithium level     Status: Abnormal   Collection Time: 04/22/20  5:23 AM  Result Value Ref Range   Lithium Lvl 1.31 (H) 0.60 - 1.20 mmol/L    Comment: Performed at [facility], [address] [place],  [address]    Current Facility-Administered Medications  Medication Dose Route Frequency Provider Last Rate Last Admin  . 0.45 % sodium chloride infusion   Intravenous Continuous Fritzi Mandes, MD 100 mL/hr at 04/22/20 0928 New Bag at 04/22/20 2725  .  acetaminophen (TYLENOL) tablet 650 mg  650 mg Oral Q6H PRN Athena Masse, MD       Or  . acetaminophen (TYLENOL) suppository 650 mg  650 mg Rectal Q6H PRN Athena Masse, MD      . cholecalciferol (VITAMIN D3) tablet 1,000 Units  1,000 Units Oral Daily Fritzi Mandes, MD      . enoxaparin (LOVENOX) injection 40 mg  40 mg Subcutaneous Q24H Judd Gaudier V, MD   40 mg at 04/21/20 1318  . feeding supplement (ENSURE ENLIVE / ENSURE PLUS) liquid 237 mL  237 mL Oral TID BM Fritzi Mandes, MD      . finasteride (PROSCAR) tablet 5 mg  5 mg Oral QHS Fritzi Mandes, MD      . Derrill Memo ON 04/23/2020] multivitamin with minerals  tablet 1 tablet  1 tablet Oral Daily Fritzi Mandes, MD      . ondansetron [facility]) tablet 4 mg  4 mg Oral Q6H PRN Athena Masse, MD       Or  . ondansetron [facility]) injection 4 mg  4 mg Intravenous Q6H PRN Athena Masse, MD      . simvastatin (ZOCOR) tablet 40 mg  40 mg Oral QHS Fritzi Mandes, MD      . tamsulosin (FLOMAX) capsule 0.4 mg  0.4 mg Oral QPC supper Fritzi Mandes, MD      . terazosin (HYTRIN) capsule 2 mg  2 mg Oral QHS Fritzi Mandes, MD      . vitamin B-12 (CYANOCOBALAMIN) tablet 1,000 mcg  1,000 mcg Oral Daily Fritzi Mandes, MD        Musculoskeletal: Strength & Muscle Tone: decreased Gait & Station: unsteady Patient leans: N/A  Psychiatric Specialty Exam: Physical Exam Vitals and nursing note reviewed.  Constitutional:      Appearance: He is well-developed and well-nourished.  HENT:     Head: Normocephalic and atraumatic.  Eyes:     Conjunctiva/sclera: Conjunctivae normal.     Pupils: Pupils are equal, round, and reactive to light.  Cardiovascular:     Heart sounds: Normal heart sounds.  Pulmonary:     Effort: Pulmonary effort is normal.  Abdominal:     Palpations: Abdomen is soft.  Musculoskeletal:        General: Normal range of motion.     Cervical back: Normal range of motion.  Skin:    General: Skin is warm and dry.  Neurological:     General: No focal deficit present.     Mental Status: He is alert.  Psychiatric:        Attention and Perception: He is inattentive.        Mood and Affect: Affect is blunt.        Speech: Speech is delayed.        Behavior: Behavior is slowed. Behavior is cooperative.        Thought Content: Thought content is not paranoid. Thought content does not include homicidal or suicidal ideation.        Cognition and Memory: Memory is impaired.     Review of Systems  Constitutional: Negative.   HENT: Negative.   Eyes: Negative.   Respiratory: Negative.   Cardiovascular: Negative.   Gastrointestinal: Negative.    Musculoskeletal: Negative.   Skin: Negative.   Neurological: Negative.   Psychiatric/Behavioral: Positive for dysphoric mood and suicidal ideas. The patient is not nervous/anxious.     Blood pressure 140/75, pulse 66, temperature 98 F (36.7 C), resp. rate 17,  height 5\' 8"  (1.727 m), weight 60.5 kg, SpO2 100 %.Body mass index is 20.28 kg/m.  General Appearance: Casual  Eye Contact:  Minimal  Speech:  Slow  Volume:  Decreased  Mood:  Dysphoric  Affect:  Congruent  Thought Process:  Disorganized  Orientation:  Full (Time, Place, and Person)  Thought Content:  Logical  Suicidal Thoughts:  Yes.  without intent/plan  Homicidal Thoughts:  No  Memory:  Immediate;   Fair Recent;   Poor Remote;   Fair  Judgement:  Fair  Insight:  Fair  Psychomotor Activity:  Decreased and Tremor  Concentration:  Concentration: Poor  Recall:  [employer] of Knowledge:  Fair  Language:  Fair  Akathisia:  No  Handed:  Right  AIMS (if indicated):     Assets:  Desire for Improvement Intimacy Social Support  ADL's:  Impaired  Cognition:  Impaired,  Mild  Sleep:        Treatment Plan Summary: Medication management and Plan 75 year old man with bipolar disorder on chronic lithium therapy presents lithium toxic probably related to dehydration and poor oral intake from illness.  Patient is currently sedated slightly on the encephalopathic side.  Endorses suicidal ideation but it is clear that he has no intention of acting on it.  I do not think that as far as that is concerned he needs a sitter and I do not think there is a significant risk of self injury in the hospital.  Given his psychiatric history that I can get from he and his wife and his current labs I would recommend not restarting lithium at this point.  I would recommend rechecking the lithium level again Monday morning.  At that point I will be glad to reconsider a plan about restarting medicine at discharge.  If his lithium level has come down to  nearly 0 and his electrolytes are stabilizing and his mental status and oral intake are improving I might recommend restarting as high as 600 mg otherwise probably restart at a significantly lower dose.  In any case he needs to follow-up closely with his outpatient psychiatrist which he and his wife are capable of doing.  Disposition: No evidence of imminent risk to self or others at present.   Patient does not meet criteria for psychiatric inpatient admission. Supportive therapy provided about ongoing stressors. Discussed crisis plan, support from social network, calling 911, coming to the Emergency Department, and calling Suicide Hotline.  Alethia Berthold, MD 04/22/2020 6:32 PM

## 2020-04-23 DIAGNOSIS — E43 Unspecified severe protein-calorie malnutrition: Secondary | ICD-10-CM

## 2020-04-23 LAB — BASIC METABOLIC PANEL
Anion gap: 6 (ref 5–15)
BUN: 23 mg/dL (ref 8–23)
CO2: 22 mmol/L (ref 22–32)
Calcium: 10.1 mg/dL (ref 8.9–10.3)
Chloride: 117 mmol/L — ABNORMAL HIGH (ref 98–111)
Creatinine, Ser: 1.11 mg/dL (ref 0.61–1.24)
GFR, Estimated: >60 mL/min (ref >60)
Glucose, Bld: 142 mg/dL — ABNORMAL HIGH (ref 70–99)
Potassium: 3.8 mmol/L (ref 3.5–5.1)
Sodium: 145 mmol/L (ref 135–145)

## 2020-04-23 LAB — LITHIUM LEVEL: Lithium Lvl: 0.84 mmol/L (ref 0.60–1.20)

## 2020-04-23 MED ORDER — BOOST / RESOURCE BREEZE PO LIQD CUSTOM
1.0000 | Freq: Three times a day (TID) | ORAL | Status: DC
  Administered 2020-04-23 – 2020-04-26 (×8): 1 via ORAL

## 2020-04-23 NOTE — Progress Notes (Signed)
[facility] NAME: Zachary Gardner    MR#:  [id number]  DATE OF BIRTH:  08/19/1945  SUBJECTIVE:   Came in with increasing nausea vomiting diarrhea. Recently admitted for COVID infection.   Patient sitting up in the bed however room keeps his eyes closed most of the time. Does follow commands. Having issues with short-term memory Wife is at bedside patient not interested in having a conversation. patient evaluated by speech therapy earlier. Continues with poor PO intake. REVIEW OF SYSTEMS:   Review of Systems  Unable to perform ROS: Medical condition   Tolerating Diet:some Tolerating PT: pending  DRUG ALLERGIES:   Allergies  Allergen Reactions  . Lactose Intolerance (Gi)     VITALS:  Blood pressure 118/63, pulse 73, temperature 98.4 F (36.9 C), temperature source Oral, resp. rate 16, height 5\' 8"  (1.727 m), weight 60.5 kg, SpO2 98 %.  PHYSICAL EXAMINATION:   Physical Exam  GENERAL:  75 y.o.-year-old patient lying in the bed with no acute distress. WeAK, fraile LUNGS: Normal breath sounds bilaterally, no wheezing, rales, rhonchi. No use of accessory muscles of respiration.  CARDIOVASCULAR: S1, S2 normal. No murmurs, rubs, or gallops.  ABDOMEN: Soft, nontender, nondistended. Bowel sounds present. No organomegaly or mass.  EXTREMITIES: No cyanosis, clubbing or edema b/l.    NEUROLOGIC:nonfocal  PSYCHIATRIC:  patient is alert and awake some baseline confusion  SKIN: No obvious rash, lesion, or ulcer.   LABORATORY PANEL:  CBC Recent Labs  Lab 04/20/20 2032  WBC 14.6*  HGB 16.6  HCT 52.4*  PLT 156    Chemistries  Recent Labs  Lab 04/20/20 2032 04/22/20 0523 04/23/20 0428  NA 142   < > 145  K 4.2   < > 3.8  CL 111   < > 117*  CO2 22   < > 22  GLUCOSE 134*   < > 142*  BUN 60*   < > 23  CREATININE 1.64*   < > 1.11  CALCIUM 11.1*   < > 10.1  AST 17  --   --   ALT 17  --   --   ALKPHOS 85  --   --   BILITOT  1.3*  --   --    < > = values in this interval not displayed.   Cardiac Enzymes No results for input(s): TROPONINI in the last 168 hours. RADIOLOGY:  No results found. ASSESSMENT AND PLAN:  75 year old male with bipolar mood disorder on lithium, HTN and GERD, with COVID-19 infection with mild symptoms 3 weeks prior with respiratory symptoms presenting with a 1 week complaint of vomiting, diarrhea, protracted nausea with decreased appetite and generalized malaise that started several days after his recovery from his respiratory Covid symptoms.  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  Bipolar mood disorder history of suicidal ideation in the past -Lithium level elevated at 2.1--1.3 --0.84while taking lithium therapeutically but patient without neurologic symptoms --likely related to fluid losses with resulting dehydration from gastrointestinal illness -Continue to monitor renal function--normal now -Hold home lithium per Dr Weber Cooks evalaution  Nutrition Status: Nutrition Problem: Severe Malnutrition Etiology: social / environmental circumstances (inadequate oral intake, also complicated by catabolic nature of recent COVID-19 infection) Signs/Symptoms: severe fat depletion,moderate  muscle depletion,severe muscle depletion,percent weight loss Percent weight loss: 12.2 % Interventions: Refer to RD note for recommendations -patient continues to have poor PO intake with failure to thrive -discussed with patient's wife in the room and son [name] the phone. Option for NG tube feeding was given to the family. Always a complication for pulling the tube out was discussed. Family aware this is not a long-term solution. IV  nutrition not recommended since patient has a working gut. Wife and son will discussed love me know. -- Son had questions regarding hospice. Discussed if family decides then will do hospice referral.    Enterocolitis with history of COVID-19 -Possibly related to COVID-19 from 3 weeks prior -CT abdomen and pelvis consistent with enterocolitis -Follow GI panel--pt has not been able to give stool sample yet - advance as tolerated -Supportive care    Generalized weakness -Secondary to enterocolitis with dehydration and lithium toxicity -Treat acute conditions as outlined above -PT consult placed. Not sure how much patient is gonna participate.  Dysphagia -- wife reports patient having difficulty swallowing. He has this going on for several months. Had appointment with ENT unable to make it since patient was not doing well. -- Appreciate speech therapy evaluation.    DVT prophylaxis: Lovenox  Code Status: full code  Family Communication:   wife at [place] on the phone Disposition Plan: Back to previous home environment Consults called: none  Status: inpatient Remains inpatient appropriate because:Inpatient level of care appropriate due to severity of illness   Dispo: The patient is from: Home              Anticipated d/c is to: Home              Patient currently is not medically stable to d/c.   Difficult to place patient No  Continue monitor renal function and lithium levels. Will await family decision regarding feeding options. Will consider hospice referral if family is considering that. PT evaluation ordered.     TOTAL TIME TAKING CARE OF THIS PATIENT: 25 minutes.  >50% time spent on counselling and coordination of care  Note: This dictation was prepared with Dragon dictation along with smaller phrase technology. Any transcriptional errors that result from this process are unintentional.  Fritzi Mandes M.D    [facility]   CC: Primary care physician;  Derinda Late, MDPatient ID: Zachary Gardner, male   DOB: 29-Oct-1945, 75 y.o.   MRN: [id number]

## 2020-04-23 NOTE — Evaluation (Cosign Needed)
Clinical/Bedside Swallow Evaluation Patient Details  Name: Zachary Gardner MRN: [id number] Date of Birth: 13-Mar-1945  Today's Date: 04/23/2020 Time: SLP Start Time (ACUTE ONLY): 1100 SLP Stop Time (ACUTE ONLY): 1200 SLP Time Calculation (min) (ACUTE ONLY): 60 min  Past Medical History:  Past Medical History:  Diagnosis Date  . Bipolar disorder ([place])   . Dysplastic nevus 03/30/2019   Left abdomen, moderate to severa atypia, excision   . GERD (gastroesophageal reflux disease)   . Hypertension    Past Surgical History:  Past Surgical History:  Procedure Laterality Date  . COLONOSCOPY    . CYST EXCISION     BACK OF LEG  . CYSTOSCOPY WITH INSERTION OF UROLIFT N/A 11/17/2019   Procedure: CYSTOSCOPY WITH INSERTION OF UROLIFT;  Surgeon: Abbie Sons, MD;  Location: [facility] ORS;  Service: Urology;  Laterality: N/A;  . HERNIA REPAIR  2004 AND 07-2019   [facility]   HPI:  Per admitting H&P "Zachary Gardner is a 75 y.o. male with medical history significant for bipolar mood disorder on lithium, HTN and GERD, with COVID-19 infection with mild symptoms 3 weeks prior, who presents to the emergency room for the second time in 5 days with a 1 week complaint of vomiting, diarrhea, protracted nausea with decreased appetite and generalized malaise that started several days after his recovery from his respiratory Covid symptoms.  He did not have vomiting and diarrhea with COVID.  States he does not vomit every day but he feels nauseated every times he eats.  Vomitus is nonbloody and nonbilious.  He has watery diarrhea daily without hematochezia or melena.  He has associated lightheadedness but denies any falls.  He denies fever or chills.  Has an occasional crampy abdominal discomfort.  On his first visit to the emergency room, on 2/19 he was hydrated and discharged and his symptoms were attributed to his recent COVID infection"   Assessment / Plan / Recommendation Clinical  Impression  Pt is 75 YO male admitted with lithium toxicity. Pt was lethargic throughout the assessment and most information was obtained from wife. Pt has eaten very little over the last few weeks since having COVID 19. Prior to [name], Pt would report that he could not swallow and often chewed solid foods over and over before eventually swallowing. Wife also reports Pt would sometimes spit up a frothy sputum. Dysphagia and reports of pain with swallowing have been ongoing since his hernia procedure in June of 2021 per wife. Instrumental tested such as Barium Swallow to r/o esophageal dysphagia and or endoscopy might provide beneficial once Pt is more alert and able to communicate and participate. Today, Pt was able to tolerate applesauce, thin liquid and a bite of solid with no s/s of aspiration. Oral transit delay with solids but no complaints of food sticking or inability to swallow even when asked. Laryngeal elevation appeared adequate and vocal quality remained clear. Wife reports Pt will only take a few bites of food and reports that everything tastes bad, even items he has requested and this has been ongoing since [place]. Lengthy discussion with wife about their attempts to try different foods and consistencies to no avail. Even though Pt is able to masticate solid foods, he fatigues easily and stops after only a few bites. Pt does take extended time to chew foods prior to swallowing. Rec trial of Dys 1 diet with thin liquids in hopes of maximizing amount of intake. Since intake has been  poor for several weeks, rec consider alternative route for nutrition temporarily until Pt is more alert and willing to take PO's more consistently. Psych/Depression and altered mental status combined with loss of appetite and taste since COVID all seem to be contributing to Dysphagia and poor Po's. Secure chat to MD to discuss the above. ST to follow up with plans and toleration of diet. If Pt receives nutrition via NGT,  recommend discontinuing diet with reassessment after a few days of nutrition. SLP Visit Diagnosis: Dysphagia, oropharyngeal phase (R13.12)    Aspiration Risk  Mild aspiration risk;Risk for inadequate nutrition/hydration    Diet Recommendation Dysphagia 1 (Puree);Alternative means - temporary;Other (Comment) (DC diet if Pt gets NGT)   Liquid Administration via: Cup;Straw Medication Administration: Crushed with puree Supervision: Staff to assist with self feeding Compensations: Slow rate;Small sips/bites;Minimize environmental distractions Postural Changes: Seated upright at 90 degrees;Remain upright for at least 30 minutes after po intake    Other  Recommendations Recommended Consults: Consider esophageal assessment;Other (Comment) (Once overall status has improved) Oral Care Recommendations: Oral care BID   Follow up Recommendations        Frequency and Duration min 3x week          Prognosis Prognosis for Safe Diet Advancement: Fair Barriers to Reach Goals: Cognitive deficits;Behavior Barriers/Prognosis Comment: Pt lethargic      Swallow Study   General Date of Onset: 04/20/20 HPI: Per admitting H&P "Zachary Gardner is a 75 y.o. male with medical history significant for bipolar mood disorder on lithium, HTN and GERD, with COVID-19 infection with mild symptoms 3 weeks prior, who presents to the emergency room for the second time in 5 days with a 1 week complaint of vomiting, diarrhea, protracted nausea with decreased appetite and generalized malaise that started several days after his recovery from his respiratory Covid symptoms.  He did not have vomiting and diarrhea with COVID.  States he does not vomit every day but he feels nauseated every times he eats.  Vomitus is nonbloody and nonbilious.  He has watery diarrhea daily without hematochezia or melena.  He has associated lightheadedness but denies any falls.  He denies fever or chills.  Has an occasional crampy abdominal  discomfort.  On his first visit to the emergency room, on 2/19 he was hydrated and discharged and his symptoms were attributed to his recent COVID infection" Type of Study: Bedside Swallow Evaluation Diet Prior to this Study: Regular Temperature Spikes Noted: No Respiratory Status: Room air Behavior/Cognition: Cooperative;Lethargic/Drowsy;Requires cueing Oral Cavity Assessment: Within Functional Limits Oral Care Completed by SLP: No Oral Cavity - Dentition: Adequate natural dentition Vision: Functional for self-feeding Self-Feeding Abilities: Needs assist Patient Positioning: Upright in bed Baseline Vocal Quality: Low vocal intensity Volitional Cough: Strong    Oral/Motor/Sensory Function Overall Oral Motor/Sensory Function: Within functional limits   Ice Chips Ice chips: Within functional limits   Thin Liquid Thin Liquid: Within functional limits Presentation: Cup;Straw    Nectar Thick Nectar Thick Liquid: Not tested   Honey Thick Honey Thick Liquid: Not tested   Puree Puree: Within functional limits Presentation: Spoon   Solid     Solid: Impaired Presentation:  (assisted) Oral Phase Impairments: Reduced lingual movement/coordination Oral Phase Functional Implications: Prolonged oral transit      Lucila Maine 04/23/2020,12:35 PM

## 2020-04-24 MED ORDER — ACETAMINOPHEN 325 MG PO TABS
650.0000 mg | ORAL_TABLET | Freq: Once | ORAL | Status: AC
  Administered 2020-04-24: 650 mg via ORAL
  Filled 2020-04-24: qty 2

## 2020-04-24 MED ORDER — DIPHENHYDRAMINE HCL 25 MG PO CAPS
25.0000 mg | ORAL_CAPSULE | Freq: Once | ORAL | Status: AC
  Administered 2020-04-24: 25 mg via ORAL
  Filled 2020-04-24: qty 1

## 2020-04-24 NOTE — Progress Notes (Signed)
[place] [facility] [place] NAME: Zachary Gardner    MR#:  [id number]  DATE OF BIRTH:  [birth year]  SUBJECTIVE:   Came in with increasing nausea vomiting diarrhea. Recently admitted for COVID infection.   Patient more alert and conversing today. Able to maintain eye contact. He was trying to feed himself. Diet. Trying to open the milk carton. Taking sips on his own.  Has some issues with memory. Tells me he has been seen bizarre things while sleeping including some people. Denies hearing voices.  Son and wife in the room. Son reports patient has had these issues with waking up with nightmares and seeing bizarre things/objects/people  REVIEW OF SYSTEMS:   Review of Systems  Constitutional: Negative for chills, fever and weight loss.  HENT: Negative for ear discharge, ear pain and nosebleeds.   Eyes: Negative for blurred vision, pain and discharge.  Respiratory: Negative for sputum production, shortness of breath, wheezing and stridor.   Cardiovascular: Negative for chest pain, palpitations, orthopnea and PND.  Gastrointestinal: Negative for abdominal pain, diarrhea, nausea and vomiting.  Genitourinary: Negative for frequency and urgency.  Musculoskeletal: Negative for back pain and joint pain.  Neurological: Positive for weakness. Negative for sensory change, speech change and focal weakness.  Psychiatric/Behavioral: Negative for depression and hallucinations. The patient is nervous/anxious.    Tolerating Diet:some Tolerating PT: SNF but ok for HH with 1:1  DRUG ALLERGIES:   Allergies  Allergen Reactions  . Lactose Intolerance (Gi)     VITALS:  Blood pressure 120/71, pulse 77, temperature 98.3 F (36.8 C), resp. rate 18, height 5\' 8"  (1.727 m), weight 60.5 kg, SpO2 99 %.  PHYSICAL EXAMINATION:   Physical Exam  GENERAL:  75 y.o.-year-old patient lying in the bed with no acute distress. WeAK, fraile LUNGS: Normal breath sounds bilaterally,  no wheezing, rales, rhonchi. No use of accessory muscles of respiration.  CARDIOVASCULAR: S1, S2 normal. No murmurs, rubs, or gallops.  ABDOMEN: Soft, nontender, nondistended. Bowel sounds present. No organomegaly or mass.  EXTREMITIES: No cyanosis, clubbing or edema b/l.    NEUROLOGIC:nonfocal  PSYCHIATRIC:  patient is alert and awake some baseline confusion more cheerful today SKIN: No obvious rash, lesion, or ulcer.   LABORATORY PANEL:  CBC Recent Labs  Lab 04/20/20 2032  WBC 14.6*  HGB 16.6  HCT 52.4*  PLT 156    Chemistries  Recent Labs  Lab 04/20/20 2032 04/22/20 0523 04/23/20 0428  NA 142   < > 145  K 4.2   < > 3.8  CL 111   < > 117*  CO2 22   < > 22  GLUCOSE 134*   < > 142*  BUN 60*   < > 23  CREATININE 1.64*   < > 1.11  CALCIUM 11.1*   < > 10.1  AST 17  --   --   ALT 17  --   --   ALKPHOS 85  --   --   BILITOT 1.3*  --   --    < > = values in this interval not displayed.   Cardiac Enzymes No results for input(s): TROPONINI in the last 168 hours. RADIOLOGY:  No results found. ASSESSMENT AND PLAN:  75 year old male with bipolar mood disorder on lithium, HTN and GERD, with COVID-19 infection with mild symptoms 3 weeks prior with respiratory symptoms presenting with a 1 week complaint of vomiting, diarrhea, protracted nausea with decreased appetite and generalized malaise that started several  days after his recovery from his respiratory Covid symptoms.  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 Unable to hold conversation. Not eating much --2/27-- patient perked up today. Sitting up and feeding himself. Family at bedside. Mentions about having nightmares and seeing bizarre objects/people. -- Dr. Weber Cooks to see patient tomorrow to determine if if his lithium can be resumed  Nutrition Status: Nutrition Problem: Severe Malnutrition Etiology: social / environmental circumstances (inadequate oral intake, also complicated by catabolic nature of recent COVID-19 infection) Signs/Symptoms: severe fat depletion,moderate muscle depletion,severe muscle depletion,percent weight loss Percent weight loss: 12.2 % Interventions: Refer to RD note for recommendations --patient continues to have poor PO intake with failure to thrive --2/26--discussed with patient's wife in the room and son Zachary Gardner the phone. Option for NG tube feeding was given to the family. Always a complication for pulling the tube out was discussed. Family aware this is not a long-term solution. IV nutrition not recommended since patient has a working gut. Wife and son will discussed love me know. -- Son had questions regarding hospice. Discussed if family decides then will do hospice referral. --2/27-- patient eating a little better. He was feeding himself this morning and afternoon. Will continue to encourage p.o. intake. -- Discussed with patient's son in the room will continue palliative care to follow as outpatient. Patient has been [facility] by [facility].    Enterocolitis with history of COVID-19 -Possibly related to COVID-19 from 3 weeks prior -CT abdomen and pelvis consistent with enterocolitis -Follow GI panel--pt has not been able to give stool sample yet - advance as tolerated -Supportive care    Generalized weakness -Secondary to enterocolitis with  dehydration and lithium toxicity -Treat acute conditions as outlined above -PT consult noted  Dysphagia -- wife reports patient having difficulty swallowing. He has this going on for several months. Had appointment with ENT unable to make it since patient was not doing well. -- Appreciate speech therapy evaluation.   -- patient tolerating pureed diet  DVT prophylaxis: Lovenox  Code Status: full code  Family Communication:   wife at [place] in the room Disposition Plan: likley Back to previous home environment with outpatient palliative care be resumed Consults called: none  Status: inpatient Remains inpatient appropriate because:Inpatient level of care appropriate due to severity of illness   Dispo: The patient is from: Home              Anticipated d/c is to: Home in 1-2 days              Patient currently is not medically stable to d/c.   Difficult to place patient No  Continue to monitor mental status. Awaiting reevaluation by psychiatry to see if patient is okay to be started back on lithium. Continue to monitor PO intake which is improved slowly.   TOTAL TIME TAKING CARE OF THIS PATIENT: 25 minutes.  >50% time spent on counselling and coordination of care  Note: This dictation was prepared with [employer]  dictation along with smaller phrase technology. Any transcriptional errors that result from this process are unintentional.  Fritzi Mandes M.D    [facility]   CC: Primary care physician; Derinda Late, MDPatient ID: Zachary Gardner, male   DOB: [birth year], 75 y.o.   MRN: [id number]

## 2020-04-24 NOTE — Evaluation (Signed)
Physical Therapy Evaluation Patient Details Name: Zachary Gardner MRN: [id number] DOB: 1945-05-07 Today's Date: 04/24/2020   History of Present Illness  Zachary Gardner is a 30yoM who comes [facility] on 2/23 c FTT, nausea. PMH: BPD on lithium, HTN, GERD, recent covid infection (Jan '22) c anorexia/malaise. Pt was here 5da in ED for similar presentation.  Clinical Impression  Pt admitted with above diagnosis. Pt currently with functional limitations due to the deficits listed below (see "PT Problem List"). Upon entry, pt in bed, appears to be waking up, lime mittens donned- he is interactive and agreeable to participate. The pt is drowsy, pleasant, and able to provide some basic info regarding prior level of function, both is vague at times when asked for greater detail. He does note subjective weakness and imbalance that is unexpected and obvious to him. MinA to EOB, minA for standing, minA for safe AMB of short distances. Pt is dizzy, and benefits from UE support in gait. Patient's performance this date reveals decreased ability, independence, and tolerance in performing all basic mobility required for performance of activities of daily living. Pt requires additional DME, close physical assistance, and cues for safe participate in mobility. Pt will benefit from skilled PT intervention to increase independence and safety with basic mobility in preparation for discharge to the venue listed below.       Follow Up Recommendations SNF;Supervision for mobility/OOB (needs close 1:1 for OOB, but caregiver may be able to provide if desires for DC to home.)    Equipment Recommendations  Rolling walker with 5" wheels    Recommendations for Other Services       Precautions / Restrictions Precautions Precautions: Fall      Mobility  Bed Mobility Overal bed mobility: Needs Assistance Bed Mobility: Supine to Sit;Sit to Supine     Supine to sit: Min assist Sit to supine: Min assist         Transfers Overall transfer level: Needs assistance Equipment used: 1 person hand held assist Transfers: Sit to/from Stand Sit to Stand: Min assist            Ambulation/Gait Ambulation/Gait assistance: Min Web designer (Feet): 100 Feet Assistive device: IV Pole Gait Pattern/deviations: Step-to pattern     General Gait Details: dizzy throughout, tries to be conversational, but fraagmented thought process, needs author to drive IV pole to avoid repeated toe stubbing on wheels.  Stairs            Wheelchair Mobility    Modified Rankin (Stroke Patients Only)       Balance Overall balance assessment: Needs assistance                                           Pertinent Vitals/Pain Pain Assessment: No/denies pain    Home Living Family/patient expects to be discharged to:: Private residence Living Arrangements: Spouse/significant other Available Help at Discharge: Family                  Prior Function Level of Independence:  (unable to get from patient, will need wife to provide)               Hand Dominance        Extremity/Trunk Assessment   Upper Extremity Assessment Upper Extremity Assessment: Generalized weakness;Overall [facility] for tasks assessed    Lower Extremity Assessment Lower Extremity Assessment: Generalized weakness;Overall  WFL for tasks assessed       Communication      Cognition Arousal/Alertness:  (drowsy, recently awake) Behavior During Therapy: WFL for tasks assessed/performed Overall Cognitive Status: No family/caregiver present to determine baseline cognitive functioning                                        General Comments      Exercises     Assessment/Plan    PT Assessment Patient needs continued PT services  PT Problem List Decreased strength;Decreased activity tolerance;Decreased balance;Decreased mobility;Decreased cognition       PT Treatment Interventions  DME instruction;Balance training;Stair training;Gait training;Functional mobility training;Therapeutic activities;Therapeutic exercise;Patient/family education    PT Goals (Current goals can be found in the Care Plan section)  Acute Rehab PT Goals Patient Stated Goal: feel better, regain strength, stop nightmaring PT Goal Formulation: With patient Time For Goal Achievement: 05/08/20 Potential to Achieve Goals: Good    Frequency Min 2X/week   Barriers to discharge        Co-evaluation               AM-PAC PT "6 Clicks" Mobility  Outcome Measure Help needed turning from your back to your side while in a flat bed without using bedrails?: A Lot Help needed moving from lying on your back to sitting on the side of a flat bed without using bedrails?: A Lot Help needed moving to and from a bed to a chair (including a wheelchair)?: A Lot Help needed standing up from a chair using your arms (e.g., wheelchair or bedside chair)?: A Lot Help needed to walk in hospital room?: A Lot Help needed climbing 3-5 steps with a railing? : A Lot 6 Click Score: 12    End of Session Equipment Utilized During Treatment: Gait belt Activity Tolerance: Patient tolerated treatment well;Patient limited by fatigue;Treatment limited secondary to medical complications (Comment) (dizziness) Patient left: in bed;with bed alarm set (meal presented, lime mittens doffed)   PT Visit Diagnosis: Difficulty in walking, not elsewhere classified (R26.2);Unsteadiness on feet (R26.81);Other abnormalities of gait and mobility (R26.89);Dizziness and giddiness (R42)    Time: 2876-8115 PT Time Calculation (min) (ACUTE ONLY): 20 min   Charges:   PT Evaluation $PT Eval Moderate Complexity: 1 Mod PT Treatments $Therapeutic Exercise: 8-22 mins        12:28 PM, 04/24/20 Etta Grandchild, PT, DPT Physical Therapist - [facility]  [phone] ([place])    Sharni Negron C 04/24/2020,  12:24 PM

## 2020-04-25 LAB — LITHIUM LEVEL: Lithium Lvl: 0.26 mmol/L — ABNORMAL LOW (ref 0.60–1.20)

## 2020-04-25 MED ORDER — FINASTERIDE 5 MG PO TABS
5.0000 mg | ORAL_TABLET | Freq: Every day | ORAL | Status: DC
  Administered 2020-04-25 – 2020-04-26 (×2): 5 mg via ORAL
  Filled 2020-04-25 (×2): qty 1

## 2020-04-25 MED ORDER — SODIUM CHLORIDE 0.9 % IV BOLUS
250.0000 mL | Freq: Once | INTRAVENOUS | Status: AC
  Administered 2020-04-25: 250 mL via INTRAVENOUS

## 2020-04-25 MED ORDER — LITHIUM CARBONATE ER 300 MG PO TBCR
300.0000 mg | EXTENDED_RELEASE_TABLET | Freq: Every day | ORAL | Status: DC
  Administered 2020-04-25: 300 mg via ORAL
  Filled 2020-04-25 (×2): qty 1

## 2020-04-25 NOTE — TOC Initial Note (Addendum)
Transition of Care [facility]) - Initial/Assessment Note    Patient Details  Name: Zachary Gardner MRN: [id number] Date of Birth: [birth year]  [facility] CM/SW Contact:    Beverly Sessions, RN Phone Number: 04/25/2020, 12:06 PM  Clinical Narrative:                 Patient admitted from home with lithium toxicity  Patient lives at home with wife, wife at bedside  PCP [facility]  Denies issues with transportation or obtaining medications PT has recommended SNF.   MD has discussed discharge plan with wife and plan is to return home with home health services and resumption of outpatient palliative. Wife states she does not have a preference of home health services.  Referral made and accepted by [name] with [facility].    Wife state that patient does not have any DME at home.  PT currently working with patient and I will await recommendations for DME at home   Update:  RW and BSC ordered.  Mardene Celeste with [employer] to deliver to room   Expected Discharge Plan: [place] Barriers to Discharge: Continued Medical Work up   Patient Goals and CMS Choice        Expected Discharge Plan and Services Expected Discharge Plan: [place]   Discharge Planning Services: CM Consult Post Acute Care Choice: [place] arrangements for the past 2 months: Single Family Home                           HH Arranged: PT,OT,Speech Therapy HH Agency: [facility] Date [place]: 04/25/20   Representative spoke with at [place]  Prior Living Arrangements/Services Living arrangements for the past 2 months: [place] with:: Spouse Patient language and need for interpreter reviewed:: Yes Do you feel safe going back to the place where you live?: Yes      Need for Family Participation in Patient Care: Yes (Comment) Care giver support system in place?: Yes (comment)   Criminal  Activity/Legal Involvement Pertinent to Current Situation/Hospitalization: No - Comment as needed  Activities of Daily Living Home Assistive Devices/Equipment: Gilford Rile (specify type) ADL Screening (condition at time of admission) Patient's cognitive ability adequate to safely complete daily activities?: Yes Is the patient deaf or have difficulty hearing?: No Does the patient have difficulty seeing, even when wearing glasses/contacts?: No Does the patient have difficulty concentrating, remembering, or making decisions?: Yes Patient able to express need for assistance with ADLs?: Yes Does the patient have difficulty dressing or bathing?: No Independently performs ADLs?: Yes (appropriate for developmental age) Does the patient have difficulty walking or climbing stairs?: Yes Weakness of Legs: Both Weakness of Arms/Hands: Both  Permission Sought/Granted                  Emotional Assessment Appearance:: Appears stated age         Psych Involvement: Yes (comment)  Admission diagnosis:  Gastroenteritis [K52.9] Lithium toxicity [T56.891A] Generalized weakness [R53.1] Nausea vomiting and diarrhea [R11.2, R19.7] Lithium toxicity, accidental or unintentional, initial encounter [J57.017B] Patient Active Problem List   Diagnosis Date Noted  . Protein-calorie malnutrition, severe 04/23/2020  . AKI (acute kidney injury) ([place]) 04/21/2020  . Enterocolitis 04/21/2020  . Generalized weakness 04/21/2020  . Lithium toxicity 04/21/2020  . History of COVID-19 04/21/2020  . Gastroenteritis 04/21/2020   PCP:  Baldemar Lenis,  Beverely Low, MD Pharmacy:   [facility] [address] [place] - [place] [address] [place] [address] Phone: [phone] Fax: [phone]     Social Determinants of Health (SDOH) Interventions    Readmission Risk Interventions No flowsheet data found.

## 2020-04-25 NOTE — Progress Notes (Signed)
PT just finished working with the patient. she will put these VS in her note. lying was 117/70, then sitting was 110/73 HR 70, Standing was 61/18 and PT had patient sit down/he did not pass out. BP 74/56 immediately after sitting then 100/64. Dr Posey Pronto informed. Order received for a NS 250ml bolus

## 2020-04-25 NOTE — Progress Notes (Signed)
BP stayed normal when orthostatic VS checked. Patient had a hard time following commands with moving from the chair to the bed. Increased pain in his back. Tylenol given. Dr Posey Pronto updated on VS. Patient will stay here tonight

## 2020-04-25 NOTE — Progress Notes (Signed)
  Speech Language Pathology Treatment: Dysphagia  Patient Details Name: Zachary Gardner MRN: [id number] DOB: 07-04-1945 Today's Date: 04/25/2020 Time: 1030-1057 SLP Time Calculation (min) (ACUTE ONLY): 27.52 min  Assessment / Plan / Recommendation Clinical Impression  MD notified ST that Pt is more alert today and may go home soon. Pt started eating better yesterday therefore family/MD opted for continued PO's verses NGT. This am, Pt was alert and conversive with ST. Pt was able to masticate solids with less oral transit delay and swallowed in a timely manner. No s/s of aspiration with 4 oz apple juice. Pt was noted to occasionally hold the liquid in his mouth. When asked to answer a question, Pt often produced an automatic swallow to verbalize the answer. Educated wife on diet and swallowing safety for discharge home. Will upgrade diet to Dys 2 while he is still here but Pt may have any consistency that he chooses at home to maximize nutritional intake.    HPI HPI: Per admitting H&P "Zachary Gardner is a 75 y.o. male with medical history significant for bipolar mood disorder on lithium, HTN and GERD, with COVID-19 infection with mild symptoms 3 weeks prior, who presents to the emergency room for the second time in 5 days with a 1 week complaint of vomiting, diarrhea, protracted nausea with decreased appetite and generalized malaise that started several days after his recovery from his respiratory Covid symptoms.  He did not have vomiting and diarrhea with COVID.  States he does not vomit every day but he feels nauseated every times he eats.  Vomitus is nonbloody and nonbilious.  He has watery diarrhea daily without hematochezia or melena.  He has associated lightheadedness but denies any falls.  He denies fever or chills.  Has an occasional crampy abdominal discomfort.  On his first visit to the emergency room, on 2/19 he was hydrated and discharged and his symptoms were attributed to his recent COVID  infection"      SLP Plan   NO ST needed at discharge       Recommendations  Diet recommendations: Dysphagia 2 (fine chop) Liquids provided via: Cup;Straw Medication Administration: Crushed with puree Supervision: Intermittent supervision to cue for compensatory strategies Compensations: Slow rate;Small sips/bites;Minimize environmental distractions Postural Changes and/or Swallow Maneuvers: Seated upright 90 degrees;Upright 30-60 min after meal                Follow up Recommendations: None SLP Visit Diagnosis: Dysphagia, oropharyngeal phase (R13.12)       GO                Lucila Maine 04/25/2020, 10:58 AM

## 2020-04-25 NOTE — Progress Notes (Signed)
Physical Therapy Treatment Patient Details Name: Zachary Gardner MRN: [id number] DOB: 06-Dec-1945 Today's Date: 04/25/2020    History of Present Illness Zachary Gardner is a 26yoM who comes to [facility] on 2/23 c FTT, nausea. PMH: BPD on lithium, HTN, GERD, recent covid infextion (Jan '22) c anoerexia/malaise. Pt was here 5da in ED for similar presentation.    PT Comments    Patient alert, family at bedside. Pt oriented to self, hospital, disoriented to date but able to recall with leading questions. Extended time needed to follow commands, and decreased awareness of deficits. Per family in room, this is not pt cognitive baseline. Pt wife reported at baseline he is independent. Wife will be available to assist 24/7.  The patient was able to perform bed mobility with CGA, HOB elevated and significant assist from bed rails. Good sitting balance noted. Assisted with urinal due to condom cath falling off. Pt did attempt to stand impulsively due to needing to urinate, but redirectable. Sit <> stand with RW and CGA, pt very limited due to severe low back pain. Did transfer to recliner ~50ft in room with RW and CGA as well. Second attempt to initiate ambulation, pt unable to maintain standing due to reported dizziness. Orthostatic vitals assessed and pt + and symptomatic. RN notified of pt status and very low BP, in chair with BP 100/64. Unable to participate in any further mobility. The patient would benefit from further skilled PT intervention to continue to progress towards goals. Recommendation remains appropriate pending further pt progress.      Follow Up Recommendations  SNF;Supervision for mobility/OOB     Equipment Recommendations  Rolling walker with 5" wheels;3in1 (PT)    Recommendations for Other Services       Precautions / Restrictions Precautions Precautions: Fall Restrictions Weight Bearing Restrictions: No    Mobility  Bed Mobility Overal bed mobility: Needs Assistance Bed  Mobility: Supine to Sit     Supine to sit: HOB elevated;Min guard     General bed mobility comments: significant time needed, heavy use of bed rails.    Transfers Overall transfer level: Needs assistance Equipment used: Rolling walker (2 wheeled) Transfers: Sit to/from Stand Sit to Stand: Min guard;Min assist         General transfer comment: Pt able to complete CGA, but needed significant time and cueing for technique. Pt reported severe low back pain with attempts  Ambulation/Gait Ambulation/Gait assistance: Min guard Gait Distance (Feet): 3 Feet Assistive device: IV Pole       General Gait Details: further ambulation deferred due to reports of dizziness   Stairs             Wheelchair Mobility    Modified Rankin (Stroke Patients Only)       Balance Overall balance assessment: Needs assistance Sitting-balance support: Feet supported Sitting balance-Leahy Scale: Good       Standing balance-Leahy Scale: Fair Standing balance comment: reliant on UE support, dizzy and weak with standing attempts                            Cognition Arousal/Alertness: Awake/alert Behavior During Therapy: WFL for tasks assessed/performed Overall Cognitive Status: Impaired/Different from baseline Area of Impairment: Orientation;Attention;Following commands;Safety/judgement;Awareness                 Orientation Level: Disoriented to;Time Current Attention Level: Selective   Following Commands: Follows one step commands with increased time Safety/Judgement: Decreased awareness of safety;Decreased  awareness of deficits Awareness: Anticipatory          Exercises Other Exercises Other Exercises: orthostatic vitals assessed, RN notified Other Exercises: pt assisted with urinal due to condom cath falling off    General Comments        Pertinent Vitals/Pain Pain Assessment: Faces Faces Pain Scale: Hurts worst Pain Location: back pain with all  mobility Pain Descriptors / Indicators: Aching;Guarding;Grimacing;Moaning Pain Intervention(s): Limited activity within patient's tolerance;Monitored during session;Repositioned;Patient requesting pain meds-RN notified    Home Living                      Prior Function            PT Goals (current goals can now be found in the care plan section) Progress towards PT goals: Progressing toward goals (limited by dizziness)    Frequency    Min 2X/week      PT Plan Current plan remains appropriate    Co-evaluation              AM-PAC PT "6 Clicks" Mobility   Outcome Measure  Help needed turning from your back to your side while in a flat bed without using bedrails?: A Little Help needed moving from lying on your back to sitting on the side of a flat bed without using bedrails?: A Little Help needed moving to and from a bed to a chair (including a wheelchair)?: A Little Help needed standing up from a chair using your arms (e.g., wheelchair or bedside chair)?: A Little Help needed to walk in hospital room?: A Lot Help needed climbing 3-5 steps with a railing? : Total 6 Click Score: 15    End of Session Equipment Utilized During Treatment: Gait belt Activity Tolerance: Patient tolerated treatment well;Treatment limited secondary to medical complications (Comment) (dizziness) Patient left: in chair;with chair alarm set;with call bell/phone within reach;with family/visitor present;with nursing/sitter in room Nurse Communication: Mobility status PT Visit Diagnosis: Difficulty in walking, not elsewhere classified (R26.2);Unsteadiness on feet (R26.81);Other abnormalities of gait and mobility (R26.89);Dizziness and giddiness (R42)     Time: 2637-8588 PT Time Calculation (min) (ACUTE ONLY): 48 min  Charges:  $Therapeutic Exercise: 23-37 mins $Therapeutic Activity: 8-22 mins                    Zachary Gardner PT, DPT 1:23 PM,04/25/20

## 2020-04-25 NOTE — Progress Notes (Signed)
[place] [facility] [place] NAME: Zachary Gardner    MR#:  [id number]  DATE OF BIRTH:  [birth year]  SUBJECTIVE:   Came in with increasing nausea vomiting diarrhea. Recently admitted for COVID infection.   Patient in much better spirits. Intermittently during having conversation has been smiling. Eating and drinking much better.  wife at bedside  Patient reports he is been having bizarre dreams/nightmares for many years.. he sees people things and objects.  REVIEW OF SYSTEMS:   Review of Systems  Constitutional: Negative for chills, fever and weight loss.  HENT: Negative for ear discharge, ear pain and nosebleeds.   Eyes: Negative for blurred vision, pain and discharge.  Respiratory: Negative for sputum production, shortness of breath, wheezing and stridor.   Cardiovascular: Negative for chest pain, palpitations, orthopnea and PND.  Gastrointestinal: Negative for abdominal pain, diarrhea, nausea and vomiting.  Genitourinary: Negative for frequency and urgency.  Musculoskeletal: Negative for back pain and joint pain.  Neurological: Positive for weakness. Negative for sensory change, speech change and focal weakness.  Psychiatric/Behavioral: Negative for depression and hallucinations. The patient is nervous/anxious.    Tolerating Diet:some Tolerating PT: SNF but ok for HH with 1:1  DRUG ALLERGIES:   Allergies  Allergen Reactions  . Lactose Intolerance (Gi)     VITALS:  Blood pressure (!) 148/80, pulse 71, temperature (!) 97.4 F (36.3 C), temperature source Oral, resp. rate 16, height 5\' 8"  (1.727 m), weight 60.5 kg, SpO2 100 %.  PHYSICAL EXAMINATION:   Physical Exam  GENERAL:  75 y.o.-year-old patient lying in the bed with no acute distress. WeAK, fraile LUNGS: Normal breath sounds bilaterally, no wheezing, rales, rhonchi. No use of accessory muscles of respiration.  CARDIOVASCULAR: S1, S2 normal. No murmurs, rubs, or gallops.  ABDOMEN:  Soft, nontender, nondistended. Bowel sounds present. No organomegaly or mass.  EXTREMITIES: No cyanosis, clubbing or edema b/l.    NEUROLOGIC:nonfocal  PSYCHIATRIC:  patient is alert and awake some baseline confusion more cheerful today SKIN: No obvious rash, lesion, or ulcer.   LABORATORY PANEL:  CBC Recent Labs  Lab 04/20/20 2032  WBC 14.6*  HGB 16.6  HCT 52.4*  PLT 156    Chemistries  Recent Labs  Lab 04/20/20 2032 04/22/20 0523 04/23/20 0428  NA 142   < > 145  K 4.2   < > 3.8  CL 111   < > 117*  CO2 22   < > 22  GLUCOSE 134*   < > 142*  BUN 60*   < > 23  CREATININE 1.64*   < > 1.11  CALCIUM 11.1*   < > 10.1  AST 17  --   --   ALT 17  --   --   ALKPHOS 85  --   --   BILITOT 1.3*  --   --    < > = values in this interval not displayed.   Cardiac Enzymes No results for input(s): TROPONINI in the last 168 hours. RADIOLOGY:  No results found. ASSESSMENT AND PLAN:  75 year old male with bipolar mood disorder on lithium, HTN and GERD, with COVID-19 infection with mild symptoms 3 weeks prior with respiratory symptoms presenting with a 1 week complaint of vomiting, diarrhea, protracted nausea with decreased appetite and generalized malaise that started several days after his recovery from his respiratory Covid symptoms.  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 Unable to hold conversation. Not eating much --2/27-- patient perked up today. Sitting up and feeding himself. Family at bedside. Mentions about having nightmares and seeing bizarre objects/people. -- Dr. Weber Cooks to see patient tomorrow to determine if if his lithium can be resumed --2/28-- continues to be in better spirits. Continues to eat better. Lithium levels today 0.26.  Orthostatic hypotension today -- patient experienced mild orthostatic hypotension recovered quickly while working with physical -- I gave him one bag of normal saline 250 mL bolus. -- Continue to monitor orthostatic vitals BID -- discontinue Terazosin  Nutrition Status: Nutrition Problem: Severe Malnutrition Etiology: social / environmental circumstances (inadequate oral intake, also complicated by catabolic nature of recent COVID-19 infection) Signs/Symptoms: severe fat depletion,moderate muscle depletion,severe muscle depletion,percent weight loss Percent weight loss: 12.2 % Interventions: Refer to RD note for recommendations --patient continues to have poor PO intake with failure to thrive --2/26--discussed with patient's wife in the room and son Zachary Gardner the phone. Option for NG tube feeding was given to the family. Always a complication for pulling the tube out was discussed. Family aware this is not a long-term solution. IV nutrition not recommended since patient has a working gut. Wife and son will discussed love me know. -- Son had questions regarding hospice. Discussed if family decides then will do hospice referral. --2/27-- patient eating a little better. He was feeding himself this morning and afternoon. Will continue to encourage p.o. intake. -- Discussed with patient's son in the room will continue palliative care to follow as outpatient. Patient has been seen by [facility].    Enterocolitis with history of COVID-19 -Possibly related to COVID-19 from 3 weeks prior -CT abdomen and pelvis  consistent with enterocolitis -Follow GI panel--pt has not been able to give stool sample yet - advance as tolerated -Supportive care    Generalized weakness -Secondary to enterocolitis with dehydration and lithium toxicity -Treat acute conditions as outlined above -PT consult noted  Dysphagia -- wife reports patient having difficulty swallowing. He has this going on for several months. Had appointment with ENT unable to make it since patient was not doing well. -- Appreciate speech therapy evaluation. --diet advanced to Dysphagia 2 -- patient tolerating pureed diet  DVT prophylaxis: Lovenox  Code Status: full code  Family Communication:   wife at [place] in the room Disposition Plan: likley Back to previous home environment with outpatient palliative care be resumed Consults called: none  Status: inpatient Remains inpatient appropriate because:Inpatient level of care appropriate due to severity of illness   Dispo: The patient is from: Home              Anticipated d/c is to: Home likely in am 04/26/2020              Patient currently is not medically stable to d/c.   Difficult to place patient No  Patient overall improving. He had orthostatic drop in blood pressure while working with physical therapy. Received IV fluid. Will continue monitor one more day blood pressure changes and if remains stable will  discharged home tomorrow  Discussed with Mrs. Leveque in the room she is appreciative of the care.  TOTAL TIME TAKING CARE OF THIS PATIENT: 25 minutes.  >50% time spent on counselling and coordination of care  Note: This dictation was prepared with Dragon dictation along with smaller phrase technology. Any transcriptional errors that result from this process are unintentional.  Fritzi Mandes M.D    [facility]   CC: Primary care physician; Derinda Late, MDPatient ID: Zachary Gardner, male   DOB: 01-30-1946, 75 y.o.   MRN: [id number]

## 2020-04-26 MED ORDER — LITHIUM CARBONATE ER 300 MG PO TBCR: 300.0000 mg | EXTENDED_RELEASE_TABLET | Freq: Every day | ORAL | 3 refills | Status: AC

## 2020-04-26 MED ORDER — ADULT MULTIVITAMIN W/MINERALS CH: 1.0000 | ORAL_TABLET | Freq: Every day | ORAL | 0 refills | Status: AC

## 2020-04-26 NOTE — Discharge Instructions (Signed)
Patient's wife to call [facility] and make appointment with his psychiatrist patient is wife to call and reschedule appointment with dermatology. For now [facility] is on hold  Diet recommendations: Dysphagia 2 (fine chop) Liquids provided via: Cup;Straw Medication Administration: Crushed with puree Supervision: Intermittent supervision to cue for compensatory strategies Compensations: Slow rate;Small sips/bites;Minimize environmental distractions Postural Changes and/or Swallow Maneuvers: Seated upright 90 degrees;Upright 30-60 min after meal

## 2020-04-26 NOTE — TOC Transition Note (Signed)
Transition of Care [facility]) - CM/SW Discharge Note   Patient Details  Name: Zachary Gardner MRN: [id number] Date of Birth: February 16, 1946  [facility] CM/SW Contact:  Beverly Sessions, RN Phone Number: 04/26/2020, 11:40 AM   Clinical Narrative:    Patient to discharge today DME has been delivered to room [name] with [facility] notified Wife to transport at discharge    Final next level of care: [place] Barriers to Discharge: No Barriers Identified   Patient Goals and CMS Choice        Discharge Placement                       Discharge Plan and Services   Discharge Planning Services: CM Consult Post Acute Care Choice: [place]: RN,PT,Nurse's Aide [place]: [facility] Date [place]: 04/26/20   Representative spoke with at [place]: [place] ([place]) Interventions     Readmission Risk Interventions No flowsheet data found.

## 2020-04-26 NOTE — Progress Notes (Signed)
Discharge instructions reviewed with the patients wife and patients son. IV removed and patient dressed. Patient assisted to the car

## 2020-04-26 NOTE — Progress Notes (Addendum)
Patient ID: Zachary Gardner, male   DOB: Aug 24, 1945, 75 y.o.   MRN: [id number]  Patient was discharged to home with home health. Son requested a phone call. Spoke with Zachary Gardner on the phone. He had questions as to when physical therapy/home health will come. I spoke with social worker and she has alerted the home health agency to reach out to family to start home health therapy sooner than 48 hours. Given name of the agency [facility]' to son to call if they do not hear from home health. Pt's initial PT eval was SNF vs home with 1:1 supervision and HH. Given his mental health issues, recent memory decline d/w wife it was thought he will likey do better in his familiar environment.  He is also followed by [facility] as out[facility]. Son had question as to if patient continues to decline what to do next. I suggested he call patient's [facility] benefits to see if he has long-term facility coverage and Lastly recommended son if patient continues to decline not do well at home bring back to the emergency room.  Son appreciated the call.

## 2020-04-26 NOTE — Discharge Summary (Signed)
[place] [facility] [place] NAME: Zachary Gardner    MR#:  [id number]  DATE OF BIRTH:  [birth year]  DATE OF ADMISSION:  04/20/2020 ADMITTING PHYSICIAN: Fritzi Mandes, MD  DATE OF DISCHARGE: 04/26/2020  PRIMARY CARE PHYSICIAN: Derinda Late, MD    ADMISSION DIAGNOSIS:  Gastroenteritis [K52.9] Lithium toxicity [T56.891A] Generalized weakness [R53.1] Nausea vomiting and diarrhea [R11.2, R19.7] Lithium toxicity, accidental or unintentional, initial encounter [T56.891A]  DISCHARGE DIAGNOSIS:  acute renal failure secondary to G.I. losses resolved lithium toxicity from chronic use in the setting of renal failure resolved bipolar disorder/depressed mood improving severe malnutrition  SECONDARY DIAGNOSIS:   Past Medical History:  Diagnosis Date  . Bipolar disorder ([place])   . Dysplastic nevus 03/30/2019   Left abdomen, moderate to severa atypia, excision   . GERD (gastroesophageal reflux disease)   . Hypertension     HOSPITAL COURSE:   75 year old male with bipolar mood disorder on lithium, HTN and GERD, with COVID-19 infection with mild symptoms 3 weeks prior with respiratory symptoms presenting with a 1 week complaint of vomiting, diarrhea, protracted nausea with decreased appetite and generalized malaise that started several days after his recovery from his respiratory Covid symptoms.  AKI (acute kidney injury) (HCC)--resovled -Believed prerenal and secondary to gastrointestinal losses more so than to lithium toxicity -Hydrate and monitor renal function -Nephrology consult appreciated -came in with Creat 1.47--1.64--1.16--1.1 -baseline creat 1.0 (October 2021)  Lithium toxicityfrom chronic use Bipolar mood disorder/dpressed mood history of suicidal ideation in the past -Lithium level elevated at 2.1--1.3 --0.84 --likely related to fluid losses with resulting dehydration from gastrointestinal illness -Continue to monitor renal  function--normal now -2/25--Hold home lithium per Dr Weber Cooks evaluation --2/26-- continues to keep his eyes closed. Unable to hold conversation. Not eating much --2/27-- patient perked up today. Sitting up and feeding himself. Family at bedside. Mentions about having nightmares and seeing bizarre objects/people. -- Dr. Weber Cooks to see patient tomorrow to determine if if his lithium can be resumed --2/28-- continues to be in better spirits. Continues to eat better. Lithium levels today 0.26. --3/1-- continues to be in better spirits. Started on lithium 300 mg daily by Dr. Weber Cooks. Patient will follow-up with [facility]. Wife to make follow-up appointment.  Orthostatic hypotension today -- patient experienced mild orthostatic hypotension recovered quickly while working with physical -- I gave him one bag of normal saline 250 mL bolus. -- Continue to monitor orthostatic vitals BID -- discontinue Terazosin -3/1 --BP much better and stable  Nutrition Status: Nutrition Problem: Severe Malnutrition Etiology: social / environmental circumstances (inadequate oral intake, also complicated by catabolic nature of recent COVID-19 infection) Signs/Symptoms: severe fat depletion,moderate muscle depletion,severe muscle depletion,percent weight loss Percent weight loss: 12.2 % Interventions: Refer to RD note for recommendations --patient continues to have poor PO intake with failure to thrive --2/26--discussed with patient's wife in the room and son Zachary Gardner the phone. Option for NG tube feeding was given to the family. Always a complication for pulling the tube out was discussed. Family aware this is not a long-term solution. IV nutrition not recommended since patient has a working gut. Wife and son will discussed love me know. -- Son had questions regarding hospice. Discussed if family decides then will do hospice referral. --2/27-- patient eating a little better. He was feeding himself this morning and  afternoon. Will continue to encourage p.o. intake. -- Discussed with patient's son in the room will continue palliative care to follow as outpatient. Patient has been  seen by [facility].  Enterocolitiswith history of COVID-19 -Possibly related to COVID-19 from 3 weeks prior -CT abdomen and pelvis consistent with enterocolitis -Follow GI panel--pt has not been able to give stool sample yet - advance as tolerated -Supportive care  Generalized weakness -Secondary to enterocolitis with dehydration and lithium toxicity -Treat acute conditions as outlined above -PT consult noted  Dysphagia -- wife reports patient having difficulty swallowing. He has this going on for several months. Had appointment with ENT unable to make it since patient was not doing well. -- Appreciate speech therapy evaluation. --diet advanced to Dysphagia 2 -- patient tolerating pureed diet--Now advanced to Dysphagia 2 with thin liquids  DVT prophylaxis:Lovenox  Code Status:full code Family Communication: wife at bedside Disposition Plan:likley Back to previous home environment with outpatient palliative care be resumed Consults called:none  Dispo: The patient is from: Home  Anticipated d/c is to: Home 04/26/2020 with [facility]  Patient currently is medically stable to d/c.              Difficult to place patient No  CONSULTS OBTAINED:  Treatment Team:  Clapacs, Madie Reno, MD  DRUG ALLERGIES:   Allergies  Allergen Reactions  . Lactose Intolerance (Gi)     DISCHARGE MEDICATIONS:   Allergies as of 04/26/2020      Reactions   Lactose Intolerance (gi)       Medication List    STOP taking these medications   lisinopril 10 MG tablet Commonly known as: ZESTRIL   lithium 300 MG tablet Replaced by: lithium carbonate 300 MG CR tablet   terazosin 2 MG capsule Commonly known as: HYTRIN   terbinafine 250 MG tablet Commonly known as: LamISIL     TAKE these medications    Azelastine HCl 137 MCG/SPRAY Soln Place 1 spray into both nostrils 2 (two) times daily.   benzonatate 200 MG capsule Commonly known as: TESSALON Take 200 mg by mouth 3 (three) times daily as needed.   cholecalciferol 25 MCG (1000 UNIT) tablet Commonly known as: VITAMIN D Take 1,000 Units by mouth daily.   finasteride 5 MG tablet Commonly known as: PROSCAR Take 5 mg by mouth at bedtime.   ketoconazole 2 % shampoo Commonly known as: NIZORAL Apply 1 application topically daily. Wash scalp, let sit 5 minutes and rinse out   lithium carbonate 300 MG CR tablet Commonly known as: LITHOBID Take 1 tablet (300 mg total) by mouth at bedtime. Replaces: lithium 300 MG tablet   loperamide 2 MG capsule Commonly known as: IMODIUM Take 2 mg by mouth in the morning and at bedtime. TAKES THIS FOR HIS LACTOSE INTOLERANCE   multivitamin with minerals Tabs tablet Take 1 tablet by mouth daily. Start taking on: April 27, 2020   naproxen sodium 220 MG tablet Commonly known as: ALEVE Take 220-440 mg by mouth 2 (two) times daily as needed (pain.).   Omeprazole 20 MG Tbec Take 20 mg by mouth in the morning and at bedtime.   silodosin 8 MG Caps capsule Commonly known as: RAPAFLO Take 1 capsule (8 mg total) by mouth daily with breakfast. What changed: when to take this   simvastatin 40 MG tablet Commonly known as: ZOCOR Take 40 mg by mouth at bedtime.   traZODone 50 MG tablet Commonly known as: DESYREL Take 50 mg by mouth at bedtime.   triamcinolone 0.1 % Commonly known as: KENALOG Apply 1 application topically as directed. Qd to bid up to 5 days per week aa arms until clear,  then prn flares, avoid face, groin   vitamin B-12 1000 MCG tablet Commonly known as: CYANOCOBALAMIN Take 1,000 mcg by mouth daily.            Durable Medical Equipment  (From admission, onward)         Start     Ordered   04/25/20 1327  For home use only DME Walker rolling  Once       Question Answer  Comment  Walker: With [place] Wheels   Patient needs a walker to treat with the following condition Weakness      04/25/20 1326   04/25/20 1327  For home use only DME 3 n 1  Once        04/25/20 1326          If you experience worsening of your admission symptoms, develop shortness of breath, life threatening emergency, suicidal or homicidal thoughts you must seek medical attention immediately by calling 911 or calling your MD immediately  if symptoms less severe.  You Must read complete instructions/literature along with all the possible adverse reactions/side effects for all the Medicines you take and that have been prescribed to you. Take any new Medicines after you have completely understood and accept all the possible adverse reactions/side effects.   Please note  You were cared for by a hospitalist during your hospital stay. If you have any questions about your discharge medications or the care you received while you were in the hospital after you are discharged, you can call the unit and asked to speak with the hospitalist on call if the hospitalist that took care of you is not available. Once you are discharged, your primary care physician will handle any further medical issues. Please note that NO REFILLS for any discharge medications will be authorized once you are discharged, as it is imperative that you return to your primary care physician (or establish a relationship with a primary care physician if you do not have one) for your aftercare needs so that they can reassess your need for medications and monitor your lab values. Today   SUBJECTIVE   Patient sitting out in the chair. Ate breakfast. Wife at bedside. Continues to be in better spirits. Smiling a whole lot today. Answering maturely the questions very appropriately.  VITAL SIGNS:  Blood pressure 105/65, pulse 89, temperature 98.4 F (36.9 C), temperature source Oral, resp. rate 14, height 5\' 8"  (1.727 m), weight 60.5 kg,  SpO2 98 %.  I/O:    Intake/Output Summary (Last 24 hours) at 04/26/2020 1115 Last data filed at 04/26/2020 0500 Gross per 24 hour  Intake 480 ml  Output 1050 ml  Net -570 ml    PHYSICAL EXAMINATION:  GENERAL:  75 y.o.-year-old patient lying in the bed with no acute distress.  Weak fraileLUNGS: Normal breath sounds bilaterally, no wheezing, rales,rhonchi or crepitation. No use of accessory muscles of respiration.  CARDIOVASCULAR: S1, S2 normal. No murmurs, rubs, or gallops.  ABDOMEN: Soft, non-tender, non-distended. Bowel sounds present. No organomegaly or mass.  EXTREMITIES: No pedal edema, cyanosis, or clubbing.  NEUROLOGIC: Cranial nerves II through XII are intact. Muscle strength 5/5 in all extremities. Sensation intact. Gait not checked.  PSYCHIATRIC: The patient is alert and oriented x 3.  SKIN: No obvious rash, lesion, or ulcer.   DATA REVIEW:   CBC  Recent Labs  Lab 04/20/20 2032  WBC 14.6*  HGB 16.6  HCT 52.4*  PLT 156    Chemistries  Recent Labs  Lab 04/20/20 2032 04/22/20 0523 04/23/20 0428  NA 142   < > 145  K 4.2   < > 3.8  CL 111   < > 117*  CO2 22   < > 22  GLUCOSE 134*   < > 142*  BUN 60*   < > 23  CREATININE 1.64*   < > 1.11  CALCIUM 11.1*   < > 10.1  AST 17  --   --   ALT 17  --   --   ALKPHOS 85  --   --   BILITOT 1.3*  --   --    < > = values in this interval not displayed.    Microbiology Results   No results found for this or any previous visit (from the past 240 hour(s)).  RADIOLOGY:  No results found.   CODE STATUS:     Code Status Orders  (From admission, onward)         Start     Ordered   04/21/20 0359  Full code  Continuous        04/21/20 0400        Code Status History    This patient has a current code status but no historical code status.   Advance Care Planning Activity       TOTAL TIME TAKING CARE OF THIS PATIENT: *40* minutes.    Fritzi Mandes M.D  [facility]    CC: Primary care physician;  Derinda Late, MD

## 2020-04-27 ENCOUNTER — Ambulatory Visit: Payer: No Typology Code available for payment source | Admitting: Dermatology

## 2020-05-10 ENCOUNTER — Other Ambulatory Visit: Payer: Self-pay | Admitting: Family Medicine

## 2020-05-10 DIAGNOSIS — R1310 Dysphagia, unspecified: Secondary | ICD-10-CM

## 2020-05-16 ENCOUNTER — Ambulatory Visit: Payer: No Typology Code available for payment source

## 2020-05-17 ENCOUNTER — Other Ambulatory Visit: Payer: Self-pay

## 2020-05-17 ENCOUNTER — Ambulatory Visit
Admission: RE | Admit: 2020-05-17 | Discharge: 2020-05-17 | Disposition: A | Discharge status: Home / Self Care | Payer: Medicare HMO | Source: Ambulatory Visit | Attending: Family Medicine | Admitting: Family Medicine

## 2020-05-17 DIAGNOSIS — R1310 Dysphagia, unspecified: Secondary | ICD-10-CM | POA: Diagnosis not present

## 2020-05-17 NOTE — Therapy (Signed)
[place], [place], [address] Phone: [phone]   Fax:     Modified Barium Swallow  Patient Details  Name: Zachary Gardner MRN: [id number] Date of Birth: November 20, 1945 No data recorded  Encounter Date: 05/17/2020   End of Session - 05/17/20 1711    Visit Number 1    Number of Visits 1    Date for SLP Re-Evaluation 05/17/20    SLP Start Time 12    SLP Stop Time  1400    SLP Time Calculation (min) 60 min    Activity Tolerance Patient tolerated treatment well           Past Medical History:  Diagnosis Date  . Bipolar disorder ([place])   . Dysplastic nevus 03/30/2019   Left abdomen, moderate to severa atypia, excision   . GERD (gastroesophageal reflux disease)   . Hypertension     Past Surgical History:  Procedure Laterality Date  . COLONOSCOPY    . CYST EXCISION     BACK OF LEG  . CYSTOSCOPY WITH INSERTION OF UROLIFT N/A 11/17/2019   Procedure: CYSTOSCOPY WITH INSERTION OF UROLIFT;  Surgeon: Abbie Sons, MD;  Location: [facility] ORS;  Service: Urology;  Laterality: N/A;  . HERNIA REPAIR  2004 AND 07-2019   [facility]    There were no vitals filed for this visit.      Subjective: Patient behavior: (alertness, ability to follow instructions, etc.): Chief complaint: dysphagia.  OM Exam: WFL; no unilateral weakness. Cough strong. Native Dentition.   Objective:  Radiological Procedure: A videoflouroscopic evaluation of oral-preparatory, reflex initiation, and pharyngeal phases of the swallow was performed; as well as a screening of the upper esophageal phase.  I. POSTURE: II. VIEW: III. COMPENSATORY STRATEGIES: IV. BOLUSES ADMINISTERED:  Thin Liquid:  Nectar-thick Liquid:  Honey-thick Liquid:  Puree:  Mechanical Soft: V. RESULTS OF EVALUATION: A. ORAL PREPARATORY PHASE: (The lips, tongue, and velum are observed for strength and coordination)        **Overall Severity Rating: WFL.  B. SWALLOW INITIATION/REFLEX: (The reflex is normal if "triggered" by the time the bolus reached the base of the tongue)  **Overall Severity Rating: [facility].  C. PHARYNGEAL PHASE: (Pharyngeal function is normal if the bolus shows rapid, smooth, and continuous transit through the pharynx and there is no pharyngeal residue after the swallow)  **Overall Severity Rating: [facility].  D. LARYNGEAL PENETRATION: (Material entering into the laryngeal inlet/vestibule but not aspirated): None  E. ASPIRATION: None F. ESOPHAGEAL PHASE: (Screening of the upper esophagus): None   ASSESSMENT:  Pt appears to present w/ No oropharyngeal phase dysphagia; No neuromuscular deficits of swallowing during this study. No aspiration or penetration of po trials was noted to occur during this study. Of note, potential challenge w/ some consistencies of foods though this was not demonstrated consistently, and pt stated he could not recall which foods "bother" him sometimes. Pt fed self and distractions were reduced which appeared to aid pt's swallowing.  During the oral phase, timely bolus management, mastication, and control of bolus propulsion for A-P transfer occurred. Oral clearing achieved w/ all trial consistencies. During the pharyngeal phase, Timely pharyngeal swallow initiation noted w/ all trial consistencies. No aspiration or penetration occurred; airway closure appeared timely, tight. No pharyngeal residue remained post swallow indicating adequate laryngeal excursion and pharyngeal pressure during the swallow.  Discussed results of MBSS, video viewed and questions answered.   PLAN/RECOMMENDATIONS:  A. Diet: Regular diet w/ Thin liquids. Pills broken, whole in Puree for easier swallowing. Monitor food consistencies to determine prep needs for ease of swallowing.  B. Swallowing Precautions: general aspiration precautions; reduce distractions in environment during oral intake, meals  C.  Recommended consultation to: N/A  D. Therapy recommendations: N/A  E. Results and recommendations were discussed w/ pt and Wife, video viewed w/ pt. All questions answered.          Dysphagia, unspecified type - Plan: DG SWALLOW FUNC OP MEDICARE SPEECH PATH, DG SWALLOW FUNC OP MEDICARE SPEECH PATH        Problem List Patient Active Problem List   Diagnosis Date Noted  . Protein-calorie malnutrition, severe 04/23/2020  . AKI (acute kidney injury) ([place]) 04/21/2020  . Enterocolitis 04/21/2020  . Generalized weakness 04/21/2020  . Lithium toxicity 04/21/2020  . History of COVID-19 04/21/2020  . Gastroenteritis 04/21/2020       Orinda Kenner, [place], [place] Speech Language Pathologist Rehab Services [phone] [facility] 05/17/2020, 5:12 PM  [facility] [place], [place], [address] Phone: [phone]   Fax:     Name: Zachary Gardner MRN: [id number] Date of Birth: 10/17/1945

## 2020-06-23 ENCOUNTER — Ambulatory Visit: Payer: No Typology Code available for payment source | Admitting: Urology

## 2020-06-24 ENCOUNTER — Other Ambulatory Visit
Admission: RE | Admit: 2020-06-24 | Discharge: 2020-06-24 | Disposition: A | Discharge status: Home / Self Care | Payer: Medicare HMO | Source: Ambulatory Visit | Attending: Family Medicine | Admitting: Family Medicine

## 2020-06-24 DIAGNOSIS — M79669 Pain in unspecified lower leg: Secondary | ICD-10-CM | POA: Insufficient documentation

## 2020-06-24 DIAGNOSIS — M7989 Other specified soft tissue disorders: Secondary | ICD-10-CM | POA: Insufficient documentation

## 2020-06-24 DIAGNOSIS — R0600 Dyspnea, unspecified: Secondary | ICD-10-CM | POA: Diagnosis present

## 2020-06-24 LAB — BRAIN NATRIURETIC PEPTIDE: B Natriuretic Peptide: 43.4 pg/mL (ref 0.0–100.0)

## 2020-06-24 LAB — D-DIMER, QUANTITATIVE: D-Dimer, Quant: 0.54 ug/mL-FEU — ABNORMAL HIGH (ref 0.00–0.50)

## 2020-07-12 ENCOUNTER — Ambulatory Visit (INDEPENDENT_AMBULATORY_CARE_PROVIDER_SITE_OTHER): Payer: Medicare HMO | Admitting: Dermatology

## 2020-07-12 ENCOUNTER — Other Ambulatory Visit: Payer: Self-pay

## 2020-07-12 DIAGNOSIS — L821 Other seborrheic keratosis: Secondary | ICD-10-CM

## 2020-07-12 DIAGNOSIS — R21 Rash and other nonspecific skin eruption: Secondary | ICD-10-CM

## 2020-07-12 DIAGNOSIS — L219 Seborrheic dermatitis, unspecified: Secondary | ICD-10-CM | POA: Diagnosis not present

## 2020-07-12 DIAGNOSIS — L578 Other skin changes due to chronic exposure to nonionizing radiation: Secondary | ICD-10-CM

## 2020-07-12 DIAGNOSIS — L570 Actinic keratosis: Secondary | ICD-10-CM

## 2020-07-12 DIAGNOSIS — B351 Tinea unguium: Secondary | ICD-10-CM | POA: Diagnosis not present

## 2020-07-12 MED ORDER — KETOCONAZOLE 2 % EX SHAM: 1.0000 "application " | MEDICATED_SHAMPOO | Freq: Every day | CUTANEOUS | 3 refills | Status: DC

## 2020-07-12 MED ORDER — TERBINAFINE HCL 250 MG PO TABS: 250.0000 mg | ORAL_TABLET | Freq: Every day | ORAL | 1 refills | Status: DC

## 2020-07-12 NOTE — Progress Notes (Signed)
Follow-Up Visit   Subjective  Zachary Gardner is a 75 y.o. male who presents for the following: Actinic Keratosis (Of the scalp - check for new or persistent skin lesions), Tinea (Of the B/L great toes - S/P Lamisil 250mg  po QD x 1 mth in January 2022 - patient has noticed some changes in color of the nails), Seborrheic dermatitis (Of the scalp - patient currently using Ketoconazole 2% shampoo twice per week which has seemed to help ), and Redness  (Of hands - patient's hands will turn red in color without change of temperature. He is concerned and would like Dr. Nicole Kindred to review his photos of when redness occurred).  He stays outdoors during day, and notices the redness later that evening.  It disappears by the next day.  Not itchy or tender. Pt missed  1 mo f/up for toenails because he was hospitalized with Covid.  The following portions of the chart were reviewed this encounter and updated as appropriate:      Review of Systems:  No other skin or systemic complaints except as noted in HPI or Assessment and Plan.  Objective  Well appearing patient in no apparent distress; mood and affect are within normal limits.  A focused examination was performed including the scalp, face, hands, arms, and feet. Relevant physical exam findings are noted in the Assessment and Plan.  Objective  B/L great toenails: Toenail dystrophy BL great toenails- improved when compared to photos.  Yellow discoloration with thickening.  About 50% nail clearance noted  Objective  B/L hands: Clear today but hx of erythema of hands.   Objective  Crown/scalp x 6, L forehead x 1 (7): Erythematous thin papules/macules with gritty scale.   Objective  Scalp: Mild scaling.   Assessment & Plan  Tinea unguium B/L great toenails  Improving, not at goal Restart Lamisil 250mg  po QD (labs reviewed from 04/2020 WNL) #30 1RF to finish course.   Terbinafine Counseling  Terbinafine is an anti-fungal medicine that can  be applied to the skin (over the counter) or taken by mouth (prescription) to treat fungal infections. The pill version is often used to treat fungal infections of the nails or scalp. While most people do not have any side effects from taking terbinafine pills, some possible side effects of the medicine can include taste changes, headache, loss of smell, vision changes, nausea, vomiting, or diarrhea.   Rare side effects can include irritation of the liver, allergic reaction, or decrease in blood counts (which may show up as not feeling well or developing an infection). If you are concerned about any of these side effects, please stop the medicine and call your doctor, or in the case of an emergency such as feeling very unwell, seek immediate medical care.    terbinafine (LAMISIL) 250 MG tablet - B/L great toenails  Rash and other nonspecific skin eruption B/L hands  Possible delayed erythema reaction to UVA rays from sunlight -    Recommend broad spectrum sunscreen daily to aa's to prevent recurrence.      AK (actinic keratosis) (7) Crown/scalp x 6, L forehead x 1  Prior to procedure, discussed risks of blister formation, small wound, skin dyspigmentation, or rare scar following cryotherapy.    Destruction of lesion - Crown/scalp x 6, L forehead x 1  Destruction method: cryotherapy   Informed consent: discussed and consent obtained   Lesion destroyed using liquid nitrogen: Yes   Region frozen until ice ball extended beyond lesion: Yes  Outcome: patient tolerated procedure well with no complications   Post-procedure details: wound care instructions given    Seborrheic dermatitis Scalp  Controlled Seborrheic Dermatitis  -  is a chronic persistent rash characterized by pinkness and scaling most commonly of the mid face but also can occur on the scalp (dandruff), ears; mid chest and mid back. It tends to be exacerbated by stress and cooler weather.  People who have neurologic  disease may experience new onset or exacerbation of existing seborrheic dermatitis.  The condition is not curable but treatable and can be controlled.  Continue Ketoconazole 2% shampoo 3d/wk. Message into scalp let sit 5-10 minutes then wash out.   Reordered Medications ketoconazole (NIZORAL) 2 % shampoo  Actinic Damage - chronic, secondary to cumulative UV radiation exposure/sun exposure over time - diffuse scaly erythematous macules with underlying dyspigmentation - Recommend daily broad spectrum sunscreen SPF 30+ to sun-exposed areas, reapply every 2 hours as needed.  - Recommend staying in the shade or wearing long sleeves, sun glasses (UVA+UVB protection) and wide brim hats (4-inch brim around the entire circumference of the hat). - Call for new or changing lesions.  Seborrheic Keratoses - Stuck-on, waxy, tan-brown papules and/or plaques  - Benign-appearing - Discussed benign etiology and prognosis. - Observe - Call for any changes  Return in about 2 months (around 09/11/2020) for AK and tinea follow up .  Luther Redo, CMA, am acting as scribe for Brendolyn Patty, MD .  Documentation: I have reviewed the above documentation for accuracy and completeness, and I agree with the above.  Brendolyn Patty MD

## 2020-07-12 NOTE — Patient Instructions (Signed)

## 2020-07-14 ENCOUNTER — Ambulatory Visit (INDEPENDENT_AMBULATORY_CARE_PROVIDER_SITE_OTHER): Payer: Medicare HMO | Admitting: Urology

## 2020-07-14 ENCOUNTER — Other Ambulatory Visit: Payer: Self-pay

## 2020-07-14 ENCOUNTER — Encounter: Payer: Self-pay | Admitting: Urology

## 2020-07-14 VITALS — BP 133/73 | HR 75 | Ht 68.0 in | Wt 151.0 lb

## 2020-07-14 DIAGNOSIS — R338 Other retention of urine: Secondary | ICD-10-CM

## 2020-07-14 DIAGNOSIS — R3915 Urgency of urination: Secondary | ICD-10-CM

## 2020-07-14 DIAGNOSIS — N401 Enlarged prostate with lower urinary tract symptoms: Secondary | ICD-10-CM | POA: Diagnosis not present

## 2020-07-14 LAB — BLADDER SCAN AMB NON-IMAGING: Scan Result: 113

## 2020-07-14 MED ORDER — MIRABEGRON ER 25 MG PO TB24: 25.0000 mg | ORAL_TABLET | Freq: Every day | ORAL | 0 refills | Status: DC

## 2020-07-14 NOTE — Progress Notes (Signed)
07/14/2020 9:30 AM   [name] 04-Mar-1945 [id number]  Referring provider: Keane Police, MD [address] [place] [address]  Chief Complaint  Patient presents with  . Benign Prostatic Hypertrophy    HPI: 75 y.o. male presents for follow-up.   Status post UroLift 11/17/2019 after an episode acute urinary retention which occurred after inguinal herniorrhaphy  At 1 month follow-up IPSS 10/35 and PVR 93 mL  He stopped silodosin and finasteride  Since his last visit he has noted worsening of his urinary symptoms with increased frequency, urgency with occasional episodes of urge incontinence.  He has nocturia x5 and an intermittent urinary stream.  Most bothersome symptom is frequency/urgency     PMH: Past Medical History:  Diagnosis Date  . Bipolar disorder ([place])   . Dysplastic nevus 03/30/2019   Left abdomen, moderate to severa atypia, excision   . GERD (gastroesophageal reflux disease)   . Hypertension     Surgical History: Past Surgical History:  Procedure Laterality Date  . COLONOSCOPY    . CYST EXCISION     BACK OF LEG  . CYSTOSCOPY WITH INSERTION OF UROLIFT N/A 11/17/2019   Procedure: CYSTOSCOPY WITH INSERTION OF UROLIFT;  Surgeon: Abbie Sons, MD;  Location: [facility] ORS;  Service: Urology;  Laterality: N/A;  . HERNIA REPAIR  2004 AND 07-2019   [facility]    Home Medications:  Allergies as of 07/14/2020      Reactions   Lactose Intolerance (gi)       Medication List       Accurate as of Jul 14, 2020  9:30 AM. If you have any questions, ask your nurse or doctor.        Azelastine HCl 137 MCG/SPRAY Soln Place 1 spray into both nostrils 2 (two) times daily.   benzonatate 200 MG capsule Commonly known as: TESSALON Take 200 mg by mouth 3 (three) times daily as needed.   cholecalciferol 25 MCG (1000 UNIT) tablet Commonly known as: VITAMIN D Take 1,000 Units by mouth daily.   finasteride 5 MG  tablet Commonly known as: PROSCAR Take 5 mg by mouth at bedtime.   ketoconazole 2 % shampoo Commonly known as: NIZORAL Apply 1 application topically daily. Wash scalp, let sit 5 minutes and rinse out   lithium carbonate 300 MG CR tablet Commonly known as: LITHOBID Take 1 tablet (300 mg total) by mouth at bedtime.   loperamide 2 MG capsule Commonly known as: IMODIUM Take 2 mg by mouth in the morning and at bedtime. TAKES THIS FOR HIS LACTOSE INTOLERANCE   multivitamin with minerals Tabs tablet Take 1 tablet by mouth daily.   naproxen sodium 220 MG tablet Commonly known as: ALEVE Take 220-440 mg by mouth 2 (two) times daily as needed (pain.).   Omeprazole 20 MG Tbec Take 20 mg by mouth in the morning and at bedtime.   silodosin 8 MG Caps capsule Commonly known as: RAPAFLO Take 1 capsule (8 mg total) by mouth daily with breakfast. What changed: when to take this   simvastatin 40 MG tablet Commonly known as: ZOCOR Take 40 mg by mouth at bedtime.   terbinafine 250 MG tablet Commonly known as: LamISIL Take 1 tablet (250 mg total) by mouth daily.   traZODone 50 MG tablet Commonly known as: DESYREL Take 50 mg by mouth at bedtime.   triamcinolone cream 0.1 % Commonly known as: KENALOG Apply 1 application topically as directed. Qd  to bid up to 5 days per week aa arms until clear, then prn flares, avoid face, groin   vitamin B-12 1000 MCG tablet Commonly known as: CYANOCOBALAMIN Take 1,000 mcg by mouth daily.       Allergies:  Allergies  Allergen Reactions  . Lactose Intolerance (Gi)     Family History: History reviewed. No pertinent family history.  Social History:  reports that he has never smoked. He has never used smokeless tobacco. He reports that he does not drink alcohol and does not use drugs.   Physical Exam: BP 133/73   Pulse 75   Ht 5\' 8"  (1.727 m)   Wt 151 lb (68.5 kg)   BMI 22.96 kg/m   Constitutional:  Alert and oriented, No acute  distress. HEENT: [place] AT, moist mucus membranes.  Trachea midline, no masses. Cardiovascular: No clubbing, cyanosis, or edema. Respiratory: Normal respiratory effort, no increased work of breathing.    Assessment & Plan:    1. Benign prostatic hyperplasia with LUTS  Voiding symptoms have worsened since his initial postoperative visit  Most bothersome are storage related voiding symptoms  Bladder scan PVR today 113 mL  Management options were discussed including a trial of medical management and cystoscopy to consider outlet procedures or additional implants.  He is initially interested in medical management  Trial Myrbetriq 25 mg daily-samples given  Follow-up 1 month for symptom recheck/repeat IPSS and bladder scan   Abbie Sons, MD  [facility] [address] [place], [place] [address] [phone]

## 2020-08-17 ENCOUNTER — Other Ambulatory Visit: Payer: Self-pay

## 2020-08-17 ENCOUNTER — Encounter: Payer: Self-pay | Admitting: Urology

## 2020-08-17 ENCOUNTER — Ambulatory Visit (INDEPENDENT_AMBULATORY_CARE_PROVIDER_SITE_OTHER): Payer: Medicare HMO | Admitting: Urology

## 2020-08-17 VITALS — BP 160/74 | HR 59 | Ht 68.0 in | Wt 146.0 lb

## 2020-08-17 DIAGNOSIS — N401 Enlarged prostate with lower urinary tract symptoms: Secondary | ICD-10-CM | POA: Diagnosis not present

## 2020-08-17 DIAGNOSIS — R3915 Urgency of urination: Secondary | ICD-10-CM | POA: Diagnosis not present

## 2020-08-17 LAB — BLADDER SCAN AMB NON-IMAGING: Scan Result: 15

## 2020-08-17 MED ORDER — MIRABEGRON ER 25 MG PO TB24: 25.0000 mg | ORAL_TABLET | Freq: Every day | ORAL | 11 refills | Status: DC

## 2020-08-17 NOTE — Progress Notes (Signed)
08/17/2020 10:18 AM   Zachary Gardner Mar 22, 1945 [id number]  Referring provider: Derinda Late, MD [phone] [address] [place],  [place] [address]  Chief Complaint  Patient presents with   Benign Prostatic Hypertrophy    HPI: 75 y.o. male presents for 1 month follow-up.  Refer to my prior note 07/14/2020 He noted significant improvement in his lower urinary tract symptoms on Myrbetriq 25 mg with improvement in his nocturia from 5+ times down to 2-3 He also noted less frequency and urgency He also wanted discussed ED today.  He has partial erections which are not firm enough for penetration.  He was interested and a PDE 5 inhibitor trial.  Denies use of oral or sublingual nitrates   PMH: Past Medical History:  Diagnosis Date   Bipolar disorder ([place])    Dysplastic nevus 03/30/2019   Left abdomen, moderate to severa atypia, excision    GERD (gastroesophageal reflux disease)    Hypertension     Surgical History: Past Surgical History:  Procedure Laterality Date   COLONOSCOPY     CYST EXCISION     BACK OF LEG   CYSTOSCOPY WITH INSERTION OF UROLIFT N/A 11/17/2019   Procedure: CYSTOSCOPY WITH INSERTION OF UROLIFT;  Surgeon: Abbie Sons, MD;  Location: [facility] ORS;  Service: Urology;  Laterality: N/A;   HERNIA REPAIR  2004 AND 07-2019   [facility]    Home Medications:  Allergies as of 08/17/2020       Reactions   Lactose Intolerance (gi)         Medication List        Accurate as of August 17, 2020 10:18 AM. If you have any questions, ask your nurse or doctor.          aspirin 81 MG chewable tablet Chew by mouth.   Azelastine HCl 137 MCG/SPRAY Soln Place 1 spray into both nostrils 2 (two) times daily.   benzonatate 200 MG capsule Commonly known as: TESSALON Take 200 mg by mouth 3 (three) times daily as needed.   cholecalciferol 25 MCG (1000 UNIT) tablet Commonly known as:  VITAMIN D Take 1,000 Units by mouth daily.   ketoconazole 2 % shampoo Commonly known as: NIZORAL Apply 1 application topically daily. Wash scalp, let sit 5 minutes and rinse out   lisinopril 10 MG tablet Commonly known as: ZESTRIL Take by mouth.   lithium carbonate 300 MG CR tablet Commonly known as: LITHOBID Take 1 tablet (300 mg total) by mouth at bedtime.   loperamide 2 MG capsule Commonly known as: IMODIUM Take 2 mg by mouth in the morning and at bedtime. TAKES THIS FOR HIS LACTOSE INTOLERANCE   mirabegron ER 25 MG Tb24 tablet Commonly known as: MYRBETRIQ Take 1 tablet (25 mg total) by mouth daily.   multivitamin with minerals Tabs tablet Take 1 tablet by mouth daily.   naproxen sodium 220 MG tablet Commonly known as: ALEVE Take 220-440 mg by mouth 2 (two) times daily as needed (pain.).   Omeprazole 20 MG Tbec Take 20 mg by mouth in the morning and at bedtime.   omeprazole 20 MG capsule Commonly known as: PRILOSEC Take by mouth.   simvastatin 40 MG tablet Commonly known as: ZOCOR Take 40 mg by mouth at bedtime.   simvastatin 40 MG tablet Commonly known as: ZOCOR Take by mouth.   terazosin 2 MG capsule Commonly known as: HYTRIN Take 1 capsule  by mouth at bedtime.   terbinafine 250 MG tablet Commonly known as: LamISIL Take 1 tablet (250 mg total) by mouth daily.   traZODone 50 MG tablet Commonly known as: DESYREL Take 50 mg by mouth at bedtime.   triamcinolone cream 0.1 % Commonly known as: KENALOG Apply 1 application topically as directed. Qd to bid up to 5 days per week aa arms until clear, then prn flares, avoid face, groin   vitamin B-12 1000 MCG tablet Commonly known as: CYANOCOBALAMIN Take 1,000 mcg by mouth daily.        Allergies:  Allergies  Allergen Reactions   Lactose Intolerance (Gi)     Family History: History reviewed. No pertinent family history.  Social History:  reports that he has never smoked. He has never used  smokeless tobacco. He reports that he does not drink alcohol and does not use drugs.   Physical Exam: BP (!) 160/74   Pulse (!) 59   Ht 5\' 8"  (1.727 m)   Wt 146 lb (66.2 kg)   BMI 22.20 kg/m   Constitutional:  Alert, No acute distress. HEENT: [place] AT, moist mucus membranes.  Trachea midline, no masses. Cardiovascular: No clubbing, cyanosis, or edema. Respiratory: Normal respiratory effort, no increased work of breathing.   Assessment & Plan:    1. Benign prostatic hyperplasia (BPH) with urinary urgency Status post UroLift Marked improvement in storage related voiding symptoms on Myrbetriq Rx sent to pharmacy Bladder scan PVR today 15 mL Follow-up 1 year or earlier for any significant change in voiding pattern  2.  Erectile dysfunction New problem Trial sildenafil 20 mg 2-5 tablets 1 hour prior to intercourse.  Recommended starting at 40 mg.  Most common side effects were discussed   Abbie Sons, MD  [facility] [address] [place], [place] [address] [phone]

## 2020-08-19 ENCOUNTER — Telehealth: Payer: Self-pay | Admitting: *Deleted

## 2020-08-19 ENCOUNTER — Encounter: Payer: Self-pay | Admitting: *Deleted

## 2020-08-19 MED ORDER — TROSPIUM CHLORIDE 20 MG PO TABS: 20.0000 mg | ORAL_TABLET | Freq: Two times a day (BID) | ORAL | 3 refills | Status: DC

## 2020-08-19 NOTE — Addendum Note (Signed)
Addended by: Abbie Sons on: 08/19/2020 03:30 PM   Modules accepted: Orders

## 2020-08-19 NOTE — Telephone Encounter (Signed)
Patient states mybetriq [place] to much money. Can he take anything else.

## 2020-08-19 NOTE — Telephone Encounter (Signed)
Rx trospium sent to pharmacy

## 2020-10-04 ENCOUNTER — Ambulatory Visit: Payer: No Typology Code available for payment source | Admitting: Dermatology

## 2020-11-03 ENCOUNTER — Other Ambulatory Visit: Payer: Self-pay

## 2020-11-03 ENCOUNTER — Ambulatory Visit: Payer: Medicare HMO | Admitting: Dermatology

## 2020-11-03 DIAGNOSIS — B351 Tinea unguium: Secondary | ICD-10-CM | POA: Diagnosis not present

## 2020-11-03 DIAGNOSIS — L570 Actinic keratosis: Secondary | ICD-10-CM | POA: Diagnosis not present

## 2020-11-03 DIAGNOSIS — L821 Other seborrheic keratosis: Secondary | ICD-10-CM

## 2020-11-03 DIAGNOSIS — L578 Other skin changes due to chronic exposure to nonionizing radiation: Secondary | ICD-10-CM

## 2020-11-03 MED ORDER — TERBINAFINE HCL 250 MG PO TABS: 250.0000 mg | ORAL_TABLET | Freq: Every day | ORAL | 0 refills | Status: DC

## 2020-11-03 NOTE — Patient Instructions (Addendum)
Cryotherapy Aftercare  Wash gently with soap and water everyday.   Apply Vaseline and Band-Aid daily until healed.    Terbinafine Counseling  Terbinafine is an anti-fungal medicine that can be applied to the skin (over the counter) or taken by mouth (prescription) to treat fungal infections. The pill version is often used to treat fungal infections of the nails or scalp. While most people do not have any side effects from taking terbinafine pills, some possible side effects of the medicine can include taste changes, headache, loss of smell, vision changes, nausea, vomiting, or diarrhea.   Rare side effects can include irritation of the liver, allergic reaction, or decrease in blood counts (which may show up as not feeling well or developing an infection). If you are concerned about any of these side effects, please stop the medicine and call your doctor, or in the case of an emergency such as feeling very unwell, seek immediate medical care.     If you have any questions or concerns for your doctor, please call our main line at [phone] and press option 4 to reach your doctor's medical assistant. If no one answers, please leave a voicemail as directed and we will return your call as soon as possible. Messages left after 4 pm will be answered the following business day.   You may also send [place] a message via [place]. We typically respond to MyChart messages within 1-2 business days.  For prescription refills, please ask your pharmacy to contact our office. Our fax number is [phone].  If you have an urgent issue when the clinic is closed that cannot wait until the next business day, you can page your doctor at the number below.    Please note that while we do our best to be available for urgent issues outside of office hours, we are not available 24/7.   If you have an urgent issue and are unable to reach [place], you may choose to seek medical care at your doctor's office, retail clinic,  urgent care center, or emergency room.  If you have a medical emergency, please immediately call 911 or go to the emergency department.  Pager Numbers  - Dr. Nehemiah Massed: [phone]  - Dr. Laurence Ferrari: [phone]  - Dr. Nicole Kindred: [phone]  In the event of inclement weather, please call our main line at [phone] for an update on the status of any delays or closures.  Dermatology Medication Tips: Please keep the boxes that topical medications come in in order to help keep track of the instructions about where and how to use these. Pharmacies typically print the medication instructions only on the boxes and not directly on the medication tubes.   If your medication is too expensive, please contact our office at [phone] option 4 or send [place] a message through [place].   We are unable to tell what your co-pay for medications will be in advance as this is different depending on your insurance coverage. However, we may be able to find a substitute medication at lower cost or fill out paperwork to get insurance to cover a needed medication.   If a prior authorization is required to get your medication covered by your insurance company, please allow [place] 1-2 business days to complete this process.  Drug prices often vary depending on where the prescription is filled and some pharmacies may offer cheaper prices.  The website www.goodrx.com contains coupons for medications through different pharmacies. The prices here do not account for what the cost may be  with help from insurance (it may be cheaper with your insurance), but the website can give you the price if you did not use any insurance.  - You can print the associated coupon and take it with your prescription to the pharmacy.  - You may also stop by our office during regular business hours and pick up a GoodRx coupon card.  - If you need your prescription sent electronically to a different pharmacy, notify our office through [facility] or by phone at [phone] option 4.

## 2020-11-03 NOTE — Progress Notes (Signed)
Follow-Up Visit   Subjective  Zachary Gardner is a 75 y.o. male who presents for the following: Actinic Keratosis (Crown and left forehead treated at last visit. ) and Tinea (Bilateral great toenails. Patient still has a few pill left of terbinafine '250mg'$  to total 3 months. ).   The following portions of the chart were reviewed this encounter and updated as appropriate:       Review of Systems:  No other skin or systemic complaints except as noted in HPI or Assessment and Plan.  Objective  Well appearing patient in no apparent distress; mood and affect are within normal limits.  A focused examination was performed including arms, face, scalp, toenails. Relevant physical exam findings are noted in the Assessment and Plan.  bilateral great toenails R great toenail with thickening yellow-brown discoloration, subungual debris 30% clearance at base. Some improvement when compared to baseline photos.  Scalp x 4, R forearm x 1 (5) Keratotic papules.   Assessment & Plan  Actinic Damage - chronic, secondary to cumulative UV radiation exposure/sun exposure over time - diffuse scaly erythematous macules with underlying dyspigmentation - Recommend daily broad spectrum sunscreen SPF 30+ to sun-exposed areas, reapply every 2 hours as needed.  - Recommend staying in the shade or wearing long sleeves, sun glasses (UVA+UVB protection) and wide brim hats (4-inch brim around the entire circumference of the hat). - Call for new or changing lesions.  Seborrheic Keratoses - Stuck-on, waxy, tan-brown papules and/or plaques  - Benign-appearing - Discussed benign etiology and prognosis. 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 Some improvement with terbinafine, but not at goal. Will continue for 1 more month, total 4 months. Labs from 08/16/20 wnl. Terbinafine '250mg'$  take 1 po QD with food dsp #30 0Rf.  Recommend patient see  podiatrist to have nails trimmed.  Pt unable to trim on his own since too thick.  Terbinafine Counseling  Terbinafine is an anti-fungal medicine that can be applied to the skin (over the counter) or taken by mouth (prescription) to treat fungal infections. The pill version is often used to treat fungal infections of the nails or scalp. While most people do not have any side effects from taking terbinafine pills, some possible side effects of the medicine can include taste changes, headache, loss of smell, vision changes, nausea, vomiting, or diarrhea.   Rare side effects can include irritation of the liver, allergic reaction, or decrease in blood counts (which may show up as not feeling well or developing an infection). If you are concerned about any of these side effects, please stop the medicine and call your doctor, or in the case of an emergency such as feeling very unwell, seek immediate medical care.    terbinafine (LAMISIL) 250 MG tablet - bilateral great toenails Take 1 tablet (250 mg total) by mouth daily. Take with food.  Hypertrophic actinic keratosis (5) Scalp x 4, R forearm x 1  vs ISKs  Actinic keratoses are precancerous spots that appear secondary to cumulative UV radiation exposure/sun exposure over time. They are chronic with expected duration over 1 year. A portion of actinic keratoses will progress to squamous cell carcinoma of the skin. It is not possible to reliably predict which spots will progress to skin cancer and so treatment is recommended to prevent development of skin cancer.  Recommend daily broad spectrum sunscreen SPF 30+ to sun-exposed areas, reapply every 2 hours as needed.  Recommend  staying in the shade or wearing long sleeves, sun glasses (UVA+UVB protection) and wide brim hats (4-inch brim around the entire circumference of the hat). Call for new or changing lesions.  Destruction of lesion - Scalp x 4, R forearm x 1  Destruction method: cryotherapy    Informed consent: discussed and consent obtained   Lesion destroyed using liquid nitrogen: Yes   Region frozen until ice ball extended beyond lesion: Yes   Outcome: patient tolerated procedure well with no complications   Post-procedure details: wound care instructions given   Additional details:  Prior to procedure, discussed risks of blister formation, small wound, skin dyspigmentation, or rare scar following cryotherapy. Recommend Vaseline ointment to treated areas while healing.   Return in about 6 months (around 05/03/2021) for UBSE and f/u tinea.  IJamesetta Orleans, CMA, am acting as scribe for Brendolyn Patty, MD .  Documentation: I have reviewed the above documentation for accuracy and completeness, and I agree with the above.  Brendolyn Patty MD

## 2020-11-28 ENCOUNTER — Other Ambulatory Visit
Admission: RE | Admit: 2020-11-28 | Discharge: 2020-11-28 | Disposition: A | Discharge status: Home / Self Care | Payer: Medicare HMO | Source: Ambulatory Visit | Attending: Internal Medicine | Admitting: Internal Medicine

## 2020-11-28 DIAGNOSIS — R1084 Generalized abdominal pain: Secondary | ICD-10-CM | POA: Insufficient documentation

## 2020-11-28 DIAGNOSIS — R197 Diarrhea, unspecified: Secondary | ICD-10-CM | POA: Insufficient documentation

## 2020-11-28 LAB — LITHIUM LEVEL: Lithium Lvl: 1.43 mmol/L — ABNORMAL HIGH (ref 0.60–1.20)

## 2021-01-04 ENCOUNTER — Other Ambulatory Visit: Payer: Self-pay | Admitting: *Deleted

## 2021-01-04 ENCOUNTER — Telehealth: Payer: Self-pay | Admitting: Urology

## 2021-01-04 MED ORDER — TROSPIUM CHLORIDE 20 MG PO TABS: 20.0000 mg | ORAL_TABLET | Freq: Two times a day (BID) | ORAL | 3 refills | Status: DC

## 2021-01-04 NOTE — Telephone Encounter (Signed)
Patient's wife, Katharine Look [phone]) called the office today requesting a refill of prescription Trospium to be sent to the [facility] on [address] in [place].

## 2021-05-16 ENCOUNTER — Other Ambulatory Visit: Payer: Self-pay | Admitting: Urology

## 2021-05-16 ENCOUNTER — Other Ambulatory Visit: Payer: Self-pay

## 2021-05-16 ENCOUNTER — Ambulatory Visit: Payer: Medicare HMO | Admitting: Dermatology

## 2021-05-16 DIAGNOSIS — D1800 Hemangioma unspecified site: Secondary | ICD-10-CM

## 2021-05-16 DIAGNOSIS — Z1283 Encounter for screening for malignant neoplasm of skin: Secondary | ICD-10-CM | POA: Diagnosis not present

## 2021-05-16 DIAGNOSIS — D225 Melanocytic nevi of trunk: Secondary | ICD-10-CM | POA: Diagnosis not present

## 2021-05-16 DIAGNOSIS — D229 Melanocytic nevi, unspecified: Secondary | ICD-10-CM | POA: Diagnosis not present

## 2021-05-16 DIAGNOSIS — L821 Other seborrheic keratosis: Secondary | ICD-10-CM

## 2021-05-16 DIAGNOSIS — L578 Other skin changes due to chronic exposure to nonionizing radiation: Secondary | ICD-10-CM

## 2021-05-16 DIAGNOSIS — L814 Other melanin hyperpigmentation: Secondary | ICD-10-CM

## 2021-05-16 DIAGNOSIS — Z86018 Personal history of other benign neoplasm: Secondary | ICD-10-CM

## 2021-05-16 DIAGNOSIS — L570 Actinic keratosis: Secondary | ICD-10-CM

## 2021-05-16 NOTE — Patient Instructions (Addendum)
Cryotherapy Aftercare ? ?Wash gently with soap and water everyday.   ?Apply Vaseline and Band-Aid daily until healed.  ? ? ?Melanoma ABCDEs ? ?Melanoma is the most dangerous type of skin cancer, and is the leading cause of death from skin disease.  You are more likely to develop melanoma if you: ?Have light-colored skin, light-colored eyes, or red or blond hair ?Spend a lot of time in the sun ?Tan regularly, either outdoors or in a tanning bed ?Have had blistering sunburns, especially during childhood ?Have a close family member who has had a melanoma ?Have atypical moles or large birthmarks ? ?Early detection of melanoma is key since treatment is typically straightforward and cure rates are extremely high if we catch it early.  ? ?The first sign of melanoma is often a change in a mole or a new dark spot.  The ABCDE system is a way of remembering the signs of melanoma. ? ?A for asymmetry:  The two halves do not match. ?B for border:  The edges of the growth are irregular. ?C for color:  A mixture of colors are present instead of an even brown color. ?D for diameter:  Melanomas are usually (but not always) greater than 48m - the size of a pencil eraser. ?E for evolution:  The spot keeps changing in size, shape, and color. ? ?Please check your skin once per month between visits. You can use a small mirror in front and a large mirror behind you to keep an eye on the back side or your body.  ? ?If you see any new or changing lesions before your next follow-up, please call to schedule a visit. ? ?Please continue daily skin protection including broad spectrum sunscreen SPF 30+ to sun-exposed areas, reapplying every 2 hours as needed when you're outdoors.   ? ? ?If You Need Anything After Your Visit ? ?If you have any questions or concerns for your doctor, please call our main line at [phone] press option 4 to reach your doctor's medical assistant. If no one answers, please leave a voicemail as directed and we  will return your call as soon as possible. Messages left after 4 pm will be answered the following business day.  ? ?You may also send [place] message via MyChart. We typically respond to MyChart messages within 1-2 business days. ? ?For prescription refills, please ask your pharmacy to contact our office. Our fax number is [phone] ? ?If you have an urgent issue when the clinic is closed that cannot wait until the next business day, you can page your doctor at the number below.   ? ?Please note that while we do our best to be available for urgent issues outside of office hours, we are not available 24/7.  ? ?If you have an urgent issue and are unable to reach [place] you may choose to seek medical care at your doctor's office, retail clinic, urgent care center, or emergency room. ? ?If you have a medical emergency, please immediately call 911 or go to the emergency department. ? ?Pager Numbers ? ?- Dr. KNehemiah Massed [phone]? ?- Dr. MLaurence Ferrari [phone]? ?- Dr. SNicole Kindred [phone]? ?In the event of inclement weather, please call our main line at [phone] an update on the status of any delays or closures. ? ?Dermatology Medication Tips: ?Please keep the boxes that topical medications come in in order to help keep track of the instructions about where and how to use these. Pharmacies typically print the  medication instructions only on the boxes and not directly on the medication tubes.  ? ?If your medication is too expensive, please contact our office at [phone] option 4 or send [place] a message through [place].  ? ?We are unable to tell what your co-pay for medications will be in advance as this is different depending on your insurance coverage. However, we may be able to find a substitute medication at lower cost or fill out paperwork to get insurance to cover a needed medication.  ? ?If a prior authorization is required to get your medication covered by your insurance company, please allow [place] 1-2  business days to complete this process. ? ?Drug prices often vary depending on where the prescription is filled and some pharmacies may offer cheaper prices. ? ?The website www.goodrx.com contains coupons for medications through different pharmacies. The prices here do not account for what the cost may be with help from insurance (it may be cheaper with your insurance), but the website can give you the price if you did not use any insurance.  ?- You can print the associated coupon and take it with your prescription to the pharmacy.  ?- You may also stop by our office during regular business hours and pick up a GoodRx coupon card.  ?- If you need your prescription sent electronically to a different pharmacy, notify our office through [facility] or by phone at [phone] option 4. ? ? ? ? ?Si Usted Necesita Algo Despu?s de Su Visita ? ?Tambi?n puede enviarnos un mensaje a trav?s de MyChart. Por lo general respondemos a los mensajes de MyChart en el transcurso de 1 a 2 d?as h?biles. ? ?Para renovar recetas, por favor pida a su farmacia que se ponga en contacto con nuestra oficina. Nuestro n?mero de fax es el [phone]. ? ?Si tiene un asunto urgente cuando la cl?nica est? cerrada y que no puede esperar hasta el siguiente d?a h?bil, puede llamar/localizar a su doctor(a) al n?mero que aparece a continuaci?n.  ? ?Por favor, tenga en cuenta que aunque hacemos todo lo posible para estar disponibles para asuntos urgentes fuera del horario de oficina, no estamos disponibles las 24 horas del d?a, los 7 d?as de la semana.  ? ?Si tiene un problema urgente y no puede comunicarse con nosotros, puede optar por buscar atenci?n m?dica  en el consultorio de su doctor(a), en una cl?nica privada, en un centro de atenci?n urgente o en una sala de emergencias. ? ?Si tiene Engineer, maintenance (IT) m?dica, por favor llame inmediatamente al 911 o vaya a la sala de emergencias. ? ?N?meros de b?per ? ?- Dr. Nehemiah Massed: [phone] ? ?- Dra.  Moye: [phone] ? ?- Dra. Nicole Kindred: [phone] ? ?En caso de inclemencias del tiempo, por favor llame a nuestra l?nea principal al [phone] para una actualizaci?n sobre el estado de cualquier retraso o cierre. ? ?Consejos para la medicaci?n en dermatolog?a: ?Por favor, guarde las cajas en las que vienen los medicamentos de uso t?pico para ayudarle a seguir las instrucciones sobre d?nde y c?mo usarlos. Las farmacias generalmente imprimen las instrucciones del medicamento s?lo en las cajas y no directamente en los tubos del [place].  ? ?Si su medicamento es muy caro, por favor, p?ngase en contacto con Zigmund Daniel llamando al [phone] y presione la opci?n 4 o env?enos un mensaje a trav?s de MyChart.  ? ?No podemos decirle cu?l ser? su copago por los medicamentos por adelantado ya que esto es diferente dependiendo de la cobertura de su seguro. Sin  embargo, es posible que podamos encontrar un medicamento sustituto a Electrical engineer un formulario para que el seguro cubra el medicamento que se considera necesario.  ? ?Si se requiere Ardelia Mems autorizaci?n previa para que su compa??a de seguros [place] su medicamento, por favor perm?tanos de 1 a 2 d?as h?biles para completar este proceso. ? ?Los precios de los medicamentos var?an con frecuencia dependiendo del Environmental consultant de d?nde se surte la receta y alguna farmacias pueden ofrecer precios m?s baratos. ? ?El sitio web www.goodrx.com tiene cupones para medicamentos de Airline pilot. Los precios aqu? no tienen en cuenta lo que podr?a costar con la ayuda del seguro (puede ser m?s barato con su seguro), pero el sitio web puede darle el precio si no utiliz? ning?n seguro.  ?- Puede imprimir el cup?n correspondiente y llevarlo con su receta a la farmacia.  ?- Tambi?n puede pasar por nuestra oficina durante el horario de atenci?n regular y recoger una tarjeta de cupones de GoodRx.  ?- Si necesita que su receta se env?e electr?nicamente a Chiropodist,  informe a nuestra oficina a trav?s de MyChart de [place] o por tel?fono llamando al [phone] y presione la opci?n 4. ? ?

## 2021-05-16 NOTE — Progress Notes (Signed)
? ?Follow-Up Visit ?  ?Subjective  ?Zachary Gardner is a 76 y.o. male who presents for the following: UBSE (Patient here for upper body skin exam and skin cancer screening.Patient does have hx of dysplastic nevi. No new or changing spots that patient is aware of./). ? ?Patient has been losing weight and not sure why. He is scheduled for colonoscopy and is being followed by PCP.  ? ?The following portions of the chart were reviewed this encounter and updated as appropriate:  ?  ?  ? ?Review of Systems:  No other skin or systemic complaints except as noted in HPI or Assessment and Plan. ? ?Objective  ?Well appearing patient in no apparent distress; mood and affect are within normal limits. ? ?All skin waist up examined. ? ?Left Upper Back ?1.5 mm medium dark brown macule at left upper back ?2 mm flesh papule at right lower eyelid, no changes per pt ? ? ? ? ?Scalp x 7 (7) ?Pink/brown scaly papules.  ? ? ? ?Assessment & Plan  ?Nevus ?Left Upper Back ? ?Benign-appearing.  Observation.  Call clinic for new or changing lesions.  Recommend daily use of broad spectrum spf 30+ sunscreen to sun-exposed areas.  ? ? ?AK (actinic keratosis) (7) ?Scalp x 7 ? ?Vs ISK's  ? ?Destruction of lesion - Scalp x 7 ? ?Destruction method: cryotherapy   ?Informed consent: discussed and consent obtained   ?Lesion destroyed using liquid nitrogen: Yes   ?Region frozen until ice ball extended beyond lesion: Yes   ?Outcome: patient tolerated procedure well with no complications   ?Post-procedure details: wound care instructions given   ?Additional details:  Prior to procedure, discussed risks of blister formation, small wound, skin dyspigmentation, or rare scar following cryotherapy. Recommend Vaseline ointment to treated areas while healing.  ? ? ?Lentigines ?- Scattered tan macules ?- Due to sun exposure ?- Benign-appearing, observe ?- Recommend daily broad spectrum sunscreen SPF 30+ to sun-exposed areas, reapply every 2 hours as needed. ?- Call  for any changes ? ?Seborrheic Keratoses ?- Stuck-on, waxy, tan-brown papules and/or plaques  ?- Benign-appearing ?- Discussed benign etiology and prognosis. ?- Observe ?- Call for any changes ? ?Melanocytic Nevi ?- Tan-brown and/or pink-flesh-colored symmetric macules and papules ?- Benign appearing on exam today ?- Observation ?- Call clinic for new or changing moles ?- Recommend daily use of broad spectrum spf 30+ sunscreen to sun-exposed areas.  ? ?Hemangiomas ?- Red papules ?- Discussed benign nature ?- Observe ?- Call for any changes ? ?Actinic Damage ?- Chronic condition, secondary to cumulative UV/sun exposure ?- diffuse scaly erythematous macules with underlying dyspigmentation ?- Recommend daily broad spectrum sunscreen SPF 30+ to sun-exposed areas, reapply every 2 hours as needed.  ?- Staying in the shade or wearing long sleeves, sun glasses (UVA+UVB protection) and wide brim hats (4-inch brim around the entire circumference of the hat) are also recommended for sun protection.  ?- Call for new or changing lesions. ? ?Skin cancer screening performed today. ? ?History of Dysplastic Nevi ?- No evidence of recurrence today ?- Recommend regular full body skin exams ?- Recommend daily broad spectrum sunscreen SPF 30+ to sun-exposed areas, reapply every 2 hours as needed.  ?- Call if any new or changing lesions are noted between office visits ? ?Return in about 6 months (around 11/16/2021) for AK follow up. ? ?Graciella Belton, RMA, am acting as scribe for Brendolyn Patty, MD . ? ?Documentation: I have reviewed the above documentation for accuracy and completeness, and  I agree with the above. ? ?Brendolyn Patty MD  ? ?

## 2021-08-18 ENCOUNTER — Ambulatory Visit: Payer: Medicare HMO | Admitting: Urology

## 2021-08-21 ENCOUNTER — Encounter: Payer: Self-pay | Admitting: Urology

## 2021-08-21 ENCOUNTER — Ambulatory Visit: Payer: Medicare HMO | Admitting: Urology

## 2021-08-21 VITALS — BP 148/74 | HR 54 | Ht 66.0 in | Wt 140.0 lb

## 2021-08-21 DIAGNOSIS — N401 Enlarged prostate with lower urinary tract symptoms: Secondary | ICD-10-CM

## 2021-08-21 DIAGNOSIS — R3915 Urgency of urination: Secondary | ICD-10-CM

## 2021-08-21 LAB — BLADDER SCAN AMB NON-IMAGING: Scan Result: 33

## 2021-08-21 NOTE — Progress Notes (Signed)
08/21/2021 10:45 AM   Zachary Gardner Zachary Gardner April 10, 1945 [id number]  Referring provider: Kandyce Rud, MD [phone] [address] [facility] [place],  [place] [address]  Chief Complaint  Patient presents with   Benign Prostatic Hypertrophy    Urologic history: 1.  BPH with LUTS Acute urinary retention after inguinal herniorrhaphy Elected UroLift performed 11/17/2019   HPI: 76 y.o. male presents for annual follow-up  At last years visit he was having frequency, urgency and nocturia Given a trial of Myrbetriq with significant improvement in symptoms however this was not covered by his insurance He did take trospium for a while but is no longer taking He has cut out caffeine and sugar and feels this has significantly improved his nocturia and storage related voiding symptoms His nocturia has decreased from 4-5 times per night down to 2 times per night Presently satisfied with his voiding pattern   PMH: Past Medical History:  Diagnosis Date   Bipolar disorder (HCC)    Dysplastic nevus 03/30/2019   Left abdomen, moderate to severa atypia, excision    GERD (gastroesophageal reflux disease)    Hypertension     Surgical History: Past Surgical History:  Procedure Laterality Date   COLONOSCOPY     CYST EXCISION     BACK OF LEG   CYSTOSCOPY WITH INSERTION OF UROLIFT N/A 11/17/2019   Procedure: CYSTOSCOPY WITH INSERTION OF UROLIFT;  Surgeon: Riki Altes, MD;  Location: [facility] ORS;  Service: Urology;  Laterality: N/A;   HERNIA REPAIR  2004 AND 07-2019   [facility]    Home Medications:  Allergies as of 08/21/2021       Reactions   Lactose Intolerance (gi)         Medication List        Accurate as of August 21, 2021 10:45 AM. If you have any questions, ask your nurse or doctor.          STOP taking these medications    Azelastine HCl 137 MCG/SPRAY Soln Stopped by: Riki Altes, MD   benzonatate 200  MG capsule Commonly known as: TESSALON Stopped by: Riki Altes, MD   ketoconazole 2 % shampoo Commonly known as: NIZORAL Stopped by: Riki Altes, MD   loperamide 2 MG capsule Commonly known as: IMODIUM Stopped by: Riki Altes, MD   naproxen sodium 220 MG tablet Commonly known as: ALEVE Stopped by: Riki Altes, MD   terazosin 2 MG capsule Commonly known as: HYTRIN Stopped by: Riki Altes, MD   terbinafine 250 MG tablet Commonly known as: LamISIL Stopped by: Riki Altes, MD   traZODone 50 MG tablet Commonly known as: DESYREL Stopped by: Riki Altes, MD   triamcinolone cream 0.1 % Commonly known as: KENALOG Stopped by: Riki Altes, MD       TAKE these medications    aspirin 81 MG chewable tablet Chew by mouth.   cholecalciferol 25 MCG (1000 UNIT) tablet Commonly known as: VITAMIN D Take 1,000 Units by mouth daily.   lisinopril 10 MG tablet Commonly known as: ZESTRIL Take by mouth.   lithium carbonate 300 MG CR tablet Commonly known as: LITHOBID Take 1 tablet (300 mg total) by mouth at bedtime.   multivitamin with minerals Tabs tablet Take 1 tablet by mouth daily.   omeprazole 20 MG capsule Commonly known as: PRILOSEC Take by mouth.   simvastatin 20 MG tablet Commonly known  as: ZOCOR Take 20 mg by mouth daily. What changed: Another medication with the same name was removed. Continue taking this medication, and follow the directions you see here. Changed by: Riki Altes, MD   trospium 20 MG tablet Commonly known as: SANCTURA Take 1 tablet by mouth twice daily   vitamin B-12 1000 MCG tablet Commonly known as: CYANOCOBALAMIN Take 1,000 mcg by mouth daily.        Allergies:  Allergies  Allergen Reactions   Lactose Intolerance (Gi)     Family History: History reviewed. No pertinent family history.  Social History:  reports that he has never smoked. He has never used smokeless tobacco. He reports that he  does not drink alcohol and does not use drugs.   Physical Exam: BP (!) 148/74   Pulse (!) 54   Ht 5\' 6"  (1.676 m)   Wt 140 lb (63.5 kg)   BMI 22.60 kg/m   Constitutional:  Alert, No acute distress. HEENT: [place] AT, moist mucus membranes.  Trachea midline, no masses. Cardiovascular: No clubbing, cyanosis, or edema. Respiratory: Normal respiratory effort, no increased work of breathing.   Assessment & Plan:    1.  BPH with LUTS Stable Storage related voiding symptoms improved with caffeine restriction PVR today 33 mL Follow-up annually or as needed for any significant change in his voiding pattern   Riki Altes, MD  [facility] [address] [place], [place] [address] [phone]

## 2021-10-31 ENCOUNTER — Encounter: Payer: Self-pay | Admitting: Internal Medicine

## 2021-11-01 ENCOUNTER — Ambulatory Visit: Payer: Medicare HMO | Admitting: Anesthesiology

## 2021-11-01 ENCOUNTER — Ambulatory Visit
Admission: RE | Admit: 2021-11-01 | Discharge: 2021-11-01 | Disposition: A | Discharge status: Home / Self Care | Payer: Medicare HMO | Attending: Internal Medicine | Admitting: Internal Medicine

## 2021-11-01 ENCOUNTER — Encounter
Admission: RE | Disposition: A | Discharge status: Home / Self Care | Payer: Self-pay | Source: Home / Self Care | Attending: Internal Medicine

## 2021-11-01 DIAGNOSIS — K219 Gastro-esophageal reflux disease without esophagitis: Secondary | ICD-10-CM | POA: Diagnosis not present

## 2021-11-01 DIAGNOSIS — I10 Essential (primary) hypertension: Secondary | ICD-10-CM | POA: Diagnosis not present

## 2021-11-01 DIAGNOSIS — Z86010 Personal history of colonic polyps: Secondary | ICD-10-CM | POA: Diagnosis not present

## 2021-11-01 DIAGNOSIS — F319 Bipolar disorder, unspecified: Secondary | ICD-10-CM | POA: Insufficient documentation

## 2021-11-01 DIAGNOSIS — Z1211 Encounter for screening for malignant neoplasm of colon: Secondary | ICD-10-CM | POA: Diagnosis present

## 2021-11-01 DIAGNOSIS — K640 First degree hemorrhoids: Secondary | ICD-10-CM | POA: Diagnosis not present

## 2021-11-01 HISTORY — PX: COLONOSCOPY: SHX5424

## 2021-11-01 SURGERY — COLONOSCOPY
Anesthesia: General

## 2021-11-01 MED ORDER — SODIUM CHLORIDE 0.9 % IV SOLN
INTRAVENOUS | Status: DC
  Administered 2021-11-01: 20 mL/h via INTRAVENOUS

## 2021-11-01 MED ORDER — PROPOFOL 10 MG/ML IV BOLUS
INTRAVENOUS | Status: DC | PRN
  Administered 2021-11-01: 80 mg via INTRAVENOUS

## 2021-11-01 MED ORDER — PROPOFOL 10 MG/ML IV BOLUS
INTRAVENOUS | Status: AC
  Filled 2021-11-01: qty 20

## 2021-11-01 MED ORDER — LIDOCAINE HCL (CARDIAC) PF 100 MG/5ML IV SOSY
PREFILLED_SYRINGE | INTRAVENOUS | Status: DC | PRN
  Administered 2021-11-01: 50 mg via INTRAVENOUS

## 2021-11-01 MED ORDER — PROPOFOL 500 MG/50ML IV EMUL
INTRAVENOUS | Status: DC | PRN
  Administered 2021-11-01: 150 ug/kg/min via INTRAVENOUS

## 2021-11-01 MED ORDER — LIDOCAINE HCL (PF) 2 % IJ SOLN
INTRAMUSCULAR | Status: AC
  Filled 2021-11-01: qty 5

## 2021-11-01 NOTE — Anesthesia Preprocedure Evaluation (Addendum)
Anesthesia Evaluation  Patient identified by MRN, date of birth, ID band Patient awake    Reviewed: Allergy & Precautions, H&P , NPO status , Patient's Chart, lab work & pertinent test results  History of Anesthesia Complications Negative for: history of anesthetic complications  Airway Mallampati: II  TM Distance: >3 FB     Dental no notable dental hx.    Pulmonary neg pulmonary ROS, neg sleep apnea, neg COPD,    breath sounds clear to auscultation       Cardiovascular Exercise Tolerance: Good hypertension, (-) angina(-) Past MI and (-) Cardiac Stents (-) dysrhythmias  Rhythm:regular Rate:Normal     Neuro/Psych PSYCHIATRIC DISORDERS Bipolar Disorder negative neurological ROS     GI/Hepatic Neg liver ROS, GERD  Controlled,  Endo/Other  negative endocrine ROS  Renal/GU negative Renal ROS  negative genitourinary   Musculoskeletal   Abdominal Normal abdominal exam  (+)   Peds  Hematology negative hematology ROS (+)   Anesthesia Other Findings Past Medical History: No date: Bipolar disorder (HCC) No date: GERD (gastroesophageal reflux disease) No date: Hypertension  Past Surgical History: No date: COLONOSCOPY No date: CYST EXCISION     Comment:  BACK OF LEG 2004 AND 07-2019: HERNIA REPAIR     Comment:  [facility]  BMI    Body Mass Index: 23.46 kg/m      Reproductive/Obstetrics negative OB ROS                            Anesthesia Physical  Anesthesia Plan  ASA: II  Anesthesia Plan: General   Post-op Pain Management: Minimal or no pain anticipated   Induction:   PONV Risk Score and Plan: Propofol infusion and TIVA  Airway Management Planned: Natural Airway  Additional Equipment:   Intra-op Plan:   Post-operative Plan:   Informed Consent: I have reviewed the patients History and Physical, chart, labs and discussed the procedure including  the risks, benefits and alternatives for the proposed anesthesia with the patient or authorized representative who has indicated his/her understanding and acceptance.     Dental advisory given  Plan Discussed with: Anesthesiologist, CRNA and Surgeon  Anesthesia Plan Comments:        Anesthesia Quick Evaluation

## 2021-11-01 NOTE — Interval H&P Note (Signed)
History and Physical Interval Note:  11/01/2021 9:44 AM  Zachary Gardner  has presented today for surgery, with the diagnosis of History of colon polyps (Z86.010).  The various methods of treatment have been discussed with the patient and family. After consideration of risks, benefits and other options for treatment, the patient has consented to  Procedure(s): COLONOSCOPY (N/A) as a surgical intervention.  The patient's history has been reviewed, patient examined, no change in status, stable for surgery.  I have reviewed the patient's chart and labs.  Questions were answered to the patient's satisfaction.     Welcome, [place]

## 2021-11-01 NOTE — Transfer of Care (Signed)
Immediate Anesthesia Transfer of Care Note  Patient: Zachary Gardner  Procedure(s) Performed: COLONOSCOPY  Patient Location: PACU and Endoscopy Unit  Anesthesia Type:General  Level of Consciousness: awake and sedated  Airway & Oxygen Therapy: Patient Spontanous Breathing  Post-op Assessment: Report given to RN  Post vital signs: stable  Last Vitals:  Vitals Value Taken Time  BP 90/47 11/01/21 1159  Temp 36.8 C 11/01/21 1159  Pulse 70 11/01/21 1201  Resp 15 11/01/21 1201  SpO2 100 % 11/01/21 1201  Vitals shown include unvalidated device data.  Last Pain:  Vitals:   11/01/21 1159  TempSrc: Temporal  PainSc: 10-Worst pain ever         Complications: No notable events documented.

## 2021-11-01 NOTE — Anesthesia Postprocedure Evaluation (Signed)
Anesthesia Post Note  Patient: Zachary Gardner  Procedure(s) Performed: COLONOSCOPY  Patient location during evaluation: Endoscopy Anesthesia Type: General Level of consciousness: awake and alert Pain management: pain level controlled Vital Signs Assessment: post-procedure vital signs reviewed and stable Respiratory status: spontaneous breathing, nonlabored ventilation and respiratory function stable Cardiovascular status: blood pressure returned to baseline and stable Postop Assessment: no apparent nausea or vomiting Anesthetic complications: no   No notable events documented.   Last Vitals:  Vitals:   11/01/21 1159 11/01/21 1219  BP:    Pulse:  68  Resp: 20   Temp: 36.8 C   SpO2:  98%    Last Pain:  Vitals:   11/01/21 1219  TempSrc:   PainSc: 0-No pain                 [place] Ouch

## 2021-11-01 NOTE — H&P (Signed)
Outpatient short stay form Pre-procedure 11/01/2021 9:44 AM Zachary Gardner K. Alice Reichert, M.D.  Primary Physician: Derinda Late, MD  Reason for visit: Personal history of adenomatous colon polyps circa 2018  History of present illness:                            Patient presents for colonoscopy for a personal hx of colon polyps. The patient denies abdominal pain, abnormal weight loss or rectal bleeding.     No current facility-administered medications for this encounter.  Medications Prior to Admission  Medication Sig Dispense Refill Last Dose   aspirin 81 MG chewable tablet Chew by mouth.      cholecalciferol (VITAMIN D) 25 MCG (1000 UNIT) tablet Take 1,000 Units by mouth daily.      lisinopril (ZESTRIL) 10 MG tablet Take by mouth.      lithium carbonate (LITHOBID) 300 MG CR tablet Take 1 tablet (300 mg total) by mouth at bedtime. 30 tablet 3    Multiple Vitamin (MULTIVITAMIN WITH MINERALS) TABS tablet Take 1 tablet by mouth daily. 30 tablet 0    omeprazole (PRILOSEC) 20 MG capsule Take by mouth.      simvastatin (ZOCOR) 20 MG tablet Take 20 mg by mouth daily.      trospium (SANCTURA) 20 MG tablet Take 1 tablet by mouth twice daily 60 tablet 0    vitamin B-12 (CYANOCOBALAMIN) 1000 MCG tablet Take 1,000 mcg by mouth daily.        Allergies  Allergen Reactions   Lactose Intolerance (Gi)      Past Medical History:  Diagnosis Date   Bipolar disorder ([place])    Dysplastic nevus 03/30/2019   Left abdomen, moderate to severa atypia, excision    GERD (gastroesophageal reflux disease)    Hypertension     Review of systems:  Otherwise negative.    Physical Exam  Gen: Alert, oriented. Appears stated age.  HEENT: /AT. PERRLA. Lungs: CTA, no wheezes. CV: RR nl S1, S2. Abd: soft, benign, no masses. BS+ Ext: No edema. Pulses 2+    Planned procedures: Proceed with colonoscopy. The patient understands the nature of the planned procedure, indications, risks, alternatives and potential  complications including but not limited to bleeding, infection, perforation, damage to internal organs and possible oversedation/side effects from anesthesia. The patient agrees and gives consent to proceed.  Please refer to procedure notes for findings, recommendations and patient disposition/instructions.     [name] K. Alice Reichert, M.D. Gastroenterology 11/01/2021  9:44 AM

## 2021-11-01 NOTE — Op Note (Signed)
[facility] Gastroenterology Patient Name: Zachary Gardner Procedure Date: 11/01/2021 11:12 AM MRN: [id number] Account #: [id number] Date of Birth: 1945/08/21 Admit Type: Outpatient Age: 76 Room: [facility] ENDO ROOM 2 Gender: Male Note Status: Finalized Instrument Name: Jasper Riling [id number] Procedure:             Colonoscopy Indications:           High risk colon cancer surveillance: Personal history                         of non-advanced adenoma Providers:             Benay Pike. Alice Reichert MD, MD Referring MD:          Caprice Renshaw MD (Referring MD) Medicines:             Propofol per Anesthesia Complications:         No immediate complications. Procedure:             Pre-Anesthesia Assessment:                        - The risks and benefits of the procedure and the                         sedation options and risks were discussed with the                         patient. All questions were answered and informed                         consent was obtained.                        - Patient identification and proposed procedure were                         verified prior to the procedure by the nurse. The                         procedure was verified in the procedure room.                        - ASA Grade Assessment: III - A patient with severe                         systemic disease.                        - After reviewing the risks and benefits, the patient                         was deemed in satisfactory condition to undergo the                         procedure.                        After obtaining informed consent, the colonoscope was                         passed under  direct vision. Throughout the procedure,                         the patient's blood pressure, pulse, and oxygen                         saturations were monitored continuously. The                         Colonoscope was introduced through the anus and                         advanced to  the the cecum, identified by appendiceal                         orifice and ileocecal valve. The colonoscopy was                         performed without difficulty. The patient tolerated                         the procedure well. The quality of the bowel                         preparation was good. The ileocecal valve, appendiceal                         orifice, and rectum were photographed. Findings:      The perianal and digital rectal examinations were normal. Pertinent       negatives include normal sphincter tone and no palpable rectal lesions.      Non-bleeding internal hemorrhoids were found during retroflexion. The       hemorrhoids were Grade I (internal hemorrhoids that do not prolapse).      The colon (entire examined portion) appeared normal. Impression:            - Non-bleeding internal hemorrhoids.                        - The entire examined colon is normal.                        - No specimens collected. Recommendation:        - Patient has a contact number available for                         emergencies. The signs and symptoms of potential                         delayed complications were discussed with the patient.                         Return to normal activities tomorrow. Written                         discharge instructions were provided to the patient.                        - Resume previous diet.                        -  Continue present medications.                        - No repeat colonoscopy due to current age (83 years                         or older) and the absence of colonic polyps.                        - You do NOT require further colon cancer screening                         measures (Annual stool testing (i.e. hemoccult, FIT,                         cologuard), sigmoidoscopy, colonoscopy or CT                         colonography). You should share this recommendation                         with your Primary Care provider.                         - Return to GI office PRN.                        - The findings and recommendations were discussed with                         the patient. Procedure Code(s):     --- Professional ---                        P8099, Colorectal cancer screening; colonoscopy on                         individual at high risk Diagnosis Code(s):     --- Professional ---                        K64.0, First degree hemorrhoids                        Z86.010, Personal history of colonic polyps CPT copyright 2019 American [employer] Association. All rights reserved. The codes documented in this report are preliminary and upon coder review may  be revised to meet current compliance requirements. Efrain Sella MD, MD 11/01/2021 11:57:26 AM This report has been signed electronically. Number of Addenda: 0 Note Initiated On: 11/01/2021 11:12 AM Scope Withdrawal Time: 0 hours 5 minutes 47 seconds  Total Procedure Duration: 0 hours 9 minutes 57 seconds  Estimated Blood Loss:  Estimated blood loss: none.      [facility]

## 2021-11-02 ENCOUNTER — Encounter: Payer: Self-pay | Admitting: Internal Medicine

## 2021-11-15 IMAGING — RF DG SWALLOWING FUNCTION
1 series · 1 of 1 positions shown · non-contrast
Comparison: None.

CLINICAL DATA: Dysphagia.

EXAM:
MODIFIED BARIUM SWALLOW
TECHNIQUE: Different consistencies of barium were administered orally to the
patient by the Speech Pathologist. Imaging of the pharynx was
performed in the lateral projection. The radiologist was present in
the fluoroscopy room for this study, providing personal supervision.
FLUOROSCOPY TIME:  Fluoroscopy Time:  1 minutes 6 seconds
Radiation Exposure Index (if provided by the fluoroscopic device):
1.4 mGy
Number of Acquired Spot Images: 0

[Series 1: cp_standard · 0.17mm/px · 1 of 1 slices shown]
[im 1/1]
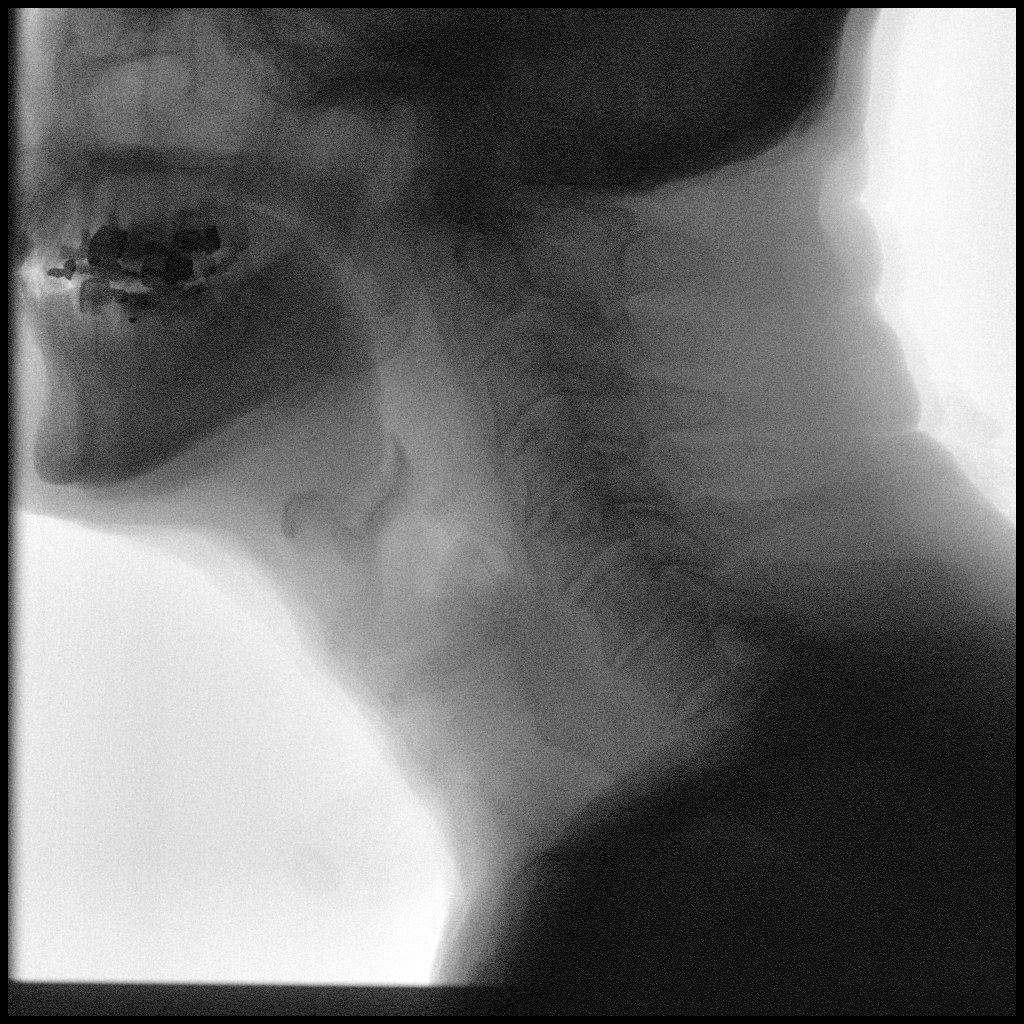

[1 of 1 positions shown; findings below may reference images not displayed]

FINDINGS: Real-time fluoroscopy of the swallowing function was performed with
a speech pathologist present.

Multiple consistencies of barium were administered which included
water, nectar, applesauce, and Wilches cracker.

Occasional difficulty initiating oropharyngeal transfer with solids.
No laryngeal penetration or tracheal aspiration.
IMPRESSION: Modified barium swallow as described above.

Please refer to the Speech Pathologists report for complete details
and recommendations.

## 2021-12-04 ENCOUNTER — Ambulatory Visit (INDEPENDENT_AMBULATORY_CARE_PROVIDER_SITE_OTHER): Payer: No Typology Code available for payment source | Admitting: Dermatology

## 2021-12-04 DIAGNOSIS — L820 Inflamed seborrheic keratosis: Secondary | ICD-10-CM

## 2021-12-04 DIAGNOSIS — D229 Melanocytic nevi, unspecified: Secondary | ICD-10-CM | POA: Diagnosis not present

## 2021-12-04 DIAGNOSIS — L570 Actinic keratosis: Secondary | ICD-10-CM

## 2021-12-04 DIAGNOSIS — B351 Tinea unguium: Secondary | ICD-10-CM | POA: Diagnosis not present

## 2021-12-04 DIAGNOSIS — L219 Seborrheic dermatitis, unspecified: Secondary | ICD-10-CM

## 2021-12-04 MED ORDER — KETOCONAZOLE 2 % EX SHAM: 1.0000 | MEDICATED_SHAMPOO | CUTANEOUS | 11 refills | Status: DC

## 2021-12-04 NOTE — Progress Notes (Signed)
Follow-Up Visit   Subjective  Zachary Gardner is a 76 y.o. male who presents for the following: Actinic Keratosis (Scalp, 5mf/u), check spots (Bumps, chest, ~142yr hx of Tinea Unguium (Bil great toenails, 74m8m Lamisil), and Seborrheic Dermatitis (Scalp, Ketoconazle 2% shampoo 1x/wk). Spot on chest is irritating.   The following portions of the chart were reviewed this encounter and updated as appropriate:       Review of Systems:  No other skin or systemic complaints except as noted in HPI or Assessment and Plan.  Objective  Well appearing patient in no apparent distress; mood and affect are within normal limits.  A focused examination was performed including face, scalp, toenails, chest, abdomen. Relevant physical exam findings are noted in the Assessment and Plan.  Scalp Pink patches with greasy scale.   R and L great toenails R great toenail clear with mild distal thickening  Scalp x 8, L forehead x 1 (9) Pink scaly macules  L sternum x 1, medial Stuck on waxy paps with erythema  L sternum Flesh pap    Assessment & Plan   Actinic Damage - chronic, secondary to cumulative UV radiation exposure/sun exposure over time - diffuse scaly erythematous macules with underlying dyspigmentation - Recommend daily broad spectrum sunscreen SPF 30+ to sun-exposed areas, reapply every 2 hours as needed.  - Recommend staying in the shade or wearing long sleeves, sun glasses (UVA+UVB protection) and wide brim hats (4-inch brim around the entire circumference of the hat). - Call for new or changing lesions.  - scalp, face  Sebaceous Hyperplasia - Small yellow papules with a central dell - Benign - Observe  - R mid cheek  History of Dysplastic Nevi - No evidence of recurrence today - Recommend regular full body skin exams - Recommend daily broad spectrum sunscreen SPF 30+ to sun-exposed areas, reapply every 2 hours as needed.  - Call if any new or changing lesions are noted  between office visits  - L abdomen  Seborrheic dermatitis Scalp  Chronic condition with duration or expected duration over one year. Currently well-controlled.   Seborrheic Dermatitis  -  is a chronic persistent rash characterized by pinkness and scaling most commonly of the mid face but also can occur on the scalp (dandruff), ears; mid chest, mid back and groin.  It tends to be exacerbated by stress and cooler weather.  People who have neurologic disease may experience new onset or exacerbation of existing seborrheic dermatitis.  The condition is not curable but treatable and can be controlled.  Cont Ketoconazole 2% shampoo 2x/wk, let sit 5 minutes and rinse out  ketoconazole (NIZORAL) 2 % shampoo - Scalp Apply 1 Application topically 2 (two) times a week. Wash scalp 2 times weekly, let sit 5 minutes and rinse out  Tinea unguium R and L great toenails  Much improved with oral Lamisil x 4 months. Observation.  AK (actinic keratosis) (9) Scalp x 8, L forehead x 1  Destruction of lesion - Scalp x 8, L forehead x 1  Destruction method: cryotherapy   Informed consent: discussed and consent obtained   Lesion destroyed using liquid nitrogen: Yes   Region frozen until ice ball extended beyond lesion: Yes   Outcome: patient tolerated procedure well with no complications   Post-procedure details: wound care instructions given   Additional details:  Prior to procedure, discussed risks of blister formation, small wound, skin dyspigmentation, or rare scar following cryotherapy. Recommend Vaseline ointment to treated areas while  healing.   Inflamed seborrheic keratosis L sternum x 1, medial  Symptomatic, irritating, patient would like treated.   Destruction of lesion - L sternum x 1, medial  Destruction method: cryotherapy   Informed consent: discussed and consent obtained   Lesion destroyed using liquid nitrogen: Yes   Region frozen until ice ball extended beyond lesion: Yes    Outcome: patient tolerated procedure well with no complications   Post-procedure details: wound care instructions given   Additional details:  Prior to procedure, discussed risks of blister formation, small wound, skin dyspigmentation, or rare scar following cryotherapy. Recommend Vaseline ointment to treated areas while healing.   Nevus L sternum  Benign-appearing.  Observation.  Call clinic for new or changing moles.  Recommend daily use of broad spectrum spf 30+ sunscreen to sun-exposed areas.     Return in about 6 months (around 06/05/2022) for UBSE, Hx of Dysplastic nevi, Hx of AKs.  I, Othelia Pulling, RMA, am acting as scribe for Brendolyn Patty, MD .  Documentation: I have reviewed the above documentation for accuracy and completeness, and I agree with the above.  Brendolyn Patty MD

## 2021-12-04 NOTE — Patient Instructions (Addendum)
Cryotherapy Aftercare  Wash gently with soap and water everyday.   Apply Vaseline and Band-Aid daily until healed.     Due to recent changes in healthcare laws, you may see results of your pathology and/or laboratory studies on MyChart before the doctors have had a chance to review them. We understand that in some cases there may be results that are confusing or concerning to you. Please understand that not all results are received at the same time and often the doctors may need to interpret multiple results in order to provide you with the best plan of care or course of treatment. Therefore, we ask that you please give us 2 business days to thoroughly review all your results before contacting the office for clarification. Should we see a critical lab result, you will be contacted sooner.   If You Need Anything After Your Visit  If you have any questions or concerns for your doctor, please call our main line at [phone] and press option 4 to reach your doctor's medical assistant. If no one answers, please leave a voicemail as directed and we will return your call as soon as possible. Messages left after 4 pm will be answered the following business day.   You may also send us a message via MyChart. We typically respond to MyChart messages within 1-2 business days.  For prescription refills, please ask your pharmacy to contact our office. Our fax number is [phone].  If you have an urgent issue when the clinic is closed that cannot wait until the next business day, you can page your doctor at the number below.    Please note that while we do our best to be available for urgent issues outside of office hours, we are not available 24/7.   If you have an urgent issue and are unable to reach us, you may choose to seek medical care at your doctor's office, retail clinic, urgent care center, or emergency room.  If you have a medical emergency, please immediately call 911 or go to the  emergency department.  Pager Numbers  - Dr. Kowalski: [phone]  - Dr. Moye: [phone]  - Dr. Stewart: [phone]  In the event of inclement weather, please call our main line at [phone] for an update on the status of any delays or closures.  Dermatology Medication Tips: Please keep the boxes that topical medications come in in order to help keep track of the instructions about where and how to use these. Pharmacies typically print the medication instructions only on the boxes and not directly on the medication tubes.   If your medication is too expensive, please contact our office at [phone] option 4 or send us a message through MyChart.   We are unable to tell what your co-pay for medications will be in advance as this is different depending on your insurance coverage. However, we may be able to find a substitute medication at lower cost or fill out paperwork to get insurance to cover a needed medication.   If a prior authorization is required to get your medication covered by your insurance company, please allow us 1-2 business days to complete this process.  Drug prices often vary depending on where the prescription is filled and some pharmacies may offer cheaper prices.  The website www.goodrx.com contains coupons for medications through different pharmacies. The prices here do not account for what the cost may be with help from insurance (it may be cheaper with your insurance), but the website can   give you the price if you did not use any insurance.  - You can print the associated coupon and take it with your prescription to the pharmacy.  - You may also stop by our office during regular business hours and pick up a GoodRx coupon card.  - If you need your prescription sent electronically to a different pharmacy, notify our office through [facility] MyChart or by phone at [phone] option 4.     Si Usted Necesita Algo Despus de Su Visita  Tambin puede  enviarnos un mensaje a travs de MyChart. Por lo general respondemos a los mensajes de MyChart en el transcurso de 1 a 2 das hbiles.  Para renovar recetas, por favor pida a su farmacia que se ponga en contacto con nuestra oficina. Nuestro nmero de fax es el [phone].  Si tiene un asunto urgente cuando la clnica est cerrada y que no puede esperar hasta el siguiente da hbil, puede llamar/localizar a su doctor(a) al nmero que aparece a continuacin.   Por favor, tenga en cuenta que aunque hacemos todo lo posible para estar disponibles para asuntos urgentes fuera del horario de oficina, no estamos disponibles las 24 horas del da, los 7 das de la semana.   Si tiene un problema urgente y no puede comunicarse con nosotros, puede optar por buscar atencin mdica  en el consultorio de su doctor(a), en una clnica privada, en un centro de atencin urgente o en una sala de emergencias.  Si tiene una emergencia mdica, por favor llame inmediatamente al 911 o vaya a la sala de emergencias.  Nmeros de bper  - Dr. Kowalski: [phone]  - Dra. Moye: [phone]  - Dra. Stewart: [phone]  En caso de inclemencias del tiempo, por favor llame a nuestra lnea principal al [phone] para una actualizacin sobre el estado de cualquier retraso o cierre.  Consejos para la medicacin en dermatologa: Por favor, guarde las cajas en las que vienen los medicamentos de uso tpico para ayudarle a seguir las instrucciones sobre dnde y cmo usarlos. Las farmacias generalmente imprimen las instrucciones del medicamento slo en las cajas y no directamente en los tubos del medicamento.   Si su medicamento es muy caro, por favor, pngase en contacto con nuestra oficina llamando al [phone] y presione la opcin 4 o envenos un mensaje a travs de MyChart.   No podemos decirle cul ser su copago por los medicamentos por adelantado ya que esto es diferente dependiendo de la cobertura de su seguro.  Sin embargo, es posible que podamos encontrar un medicamento sustituto a menor costo o llenar un formulario para que el seguro cubra el medicamento que se considera necesario.   Si se requiere una autorizacin previa para que su compaa de seguros cubra su medicamento, por favor permtanos de 1 a 2 das hbiles para completar este proceso.  Los precios de los medicamentos varan con frecuencia dependiendo del lugar de dnde se surte la receta y alguna farmacias pueden ofrecer precios ms baratos.  El sitio web www.goodrx.com tiene cupones para medicamentos de diferentes farmacias. Los precios aqu no tienen en cuenta lo que podra costar con la ayuda del seguro (puede ser ms barato con su seguro), pero el sitio web puede darle el precio si no utiliz ningn seguro.  - Puede imprimir el cupn correspondiente y llevarlo con su receta a la farmacia.  - Tambin puede pasar por nuestra oficina durante el horario de atencin regular y recoger una tarjeta de cupones de GoodRx.  -   Si necesita que su receta se enve electrnicamente a una farmacia diferente, informe a nuestra oficina a travs de MyChart de [place] o por telfono llamando al [phone] y presione la opcin 4.  

## 2022-06-25 ENCOUNTER — Ambulatory Visit: Payer: Medicare HMO | Admitting: Dermatology

## 2022-06-25 VITALS — BP 148/77 | HR 67

## 2022-06-25 DIAGNOSIS — L570 Actinic keratosis: Secondary | ICD-10-CM

## 2022-06-25 DIAGNOSIS — Z86018 Personal history of other benign neoplasm: Secondary | ICD-10-CM | POA: Diagnosis not present

## 2022-06-25 DIAGNOSIS — L219 Seborrheic dermatitis, unspecified: Secondary | ICD-10-CM | POA: Diagnosis not present

## 2022-06-25 DIAGNOSIS — L820 Inflamed seborrheic keratosis: Secondary | ICD-10-CM | POA: Diagnosis not present

## 2022-06-25 DIAGNOSIS — Z1283 Encounter for screening for malignant neoplasm of skin: Secondary | ICD-10-CM

## 2022-06-25 DIAGNOSIS — D225 Melanocytic nevi of trunk: Secondary | ICD-10-CM

## 2022-06-25 DIAGNOSIS — D692 Other nonthrombocytopenic purpura: Secondary | ICD-10-CM

## 2022-06-25 DIAGNOSIS — L821 Other seborrheic keratosis: Secondary | ICD-10-CM

## 2022-06-25 DIAGNOSIS — L578 Other skin changes due to chronic exposure to nonionizing radiation: Secondary | ICD-10-CM

## 2022-06-25 DIAGNOSIS — L814 Other melanin hyperpigmentation: Secondary | ICD-10-CM

## 2022-06-25 DIAGNOSIS — D22112 Melanocytic nevi of right lower eyelid, including canthus: Secondary | ICD-10-CM

## 2022-06-25 DIAGNOSIS — D229 Melanocytic nevi, unspecified: Secondary | ICD-10-CM

## 2022-06-25 MED ORDER — KETOCONAZOLE 2 % EX SHAM: 1.0000 | MEDICATED_SHAMPOO | CUTANEOUS | 11 refills | Status: AC

## 2022-06-25 NOTE — Patient Instructions (Addendum)
Cryotherapy Aftercare  Wash gently with soap and water everyday.   Apply Vaseline and Band-Aid daily until healed.     Due to recent changes in healthcare laws, you may see results of your pathology and/or laboratory studies on MyChart before the doctors have had a chance to review them. We understand that in some cases there may be results that are confusing or concerning to you. Please understand that not all results are received at the same time and often the doctors may need to interpret multiple results in order to provide you with the best plan of care or course of treatment. Therefore, we ask that you please give us 2 business days to thoroughly review all your results before contacting the office for clarification. Should we see a critical lab result, you will be contacted sooner.   If You Need Anything After Your Visit  If you have any questions or concerns for your doctor, please call our main line at [phone] and press option 4 to reach your doctor's medical assistant. If no one answers, please leave a voicemail as directed and we will return your call as soon as possible. Messages left after 4 pm will be answered the following business day.   You may also send us a message via MyChart. We typically respond to MyChart messages within 1-2 business days.  For prescription refills, please ask your pharmacy to contact our office. Our fax number is [phone].  If you have an urgent issue when the clinic is closed that cannot wait until the next business day, you can page your doctor at the number below.    Please note that while we do our best to be available for urgent issues outside of office hours, we are not available 24/7.   If you have an urgent issue and are unable to reach us, you may choose to seek medical care at your doctor's office, retail clinic, urgent care center, or emergency room.  If you have a medical emergency, please immediately call 911 or go to the  emergency department.  Pager Numbers  - Dr. Kowalski: [phone]  - Dr. Moye: [phone]  - Dr. Stewart: [phone]  In the event of inclement weather, please call our main line at [phone] for an update on the status of any delays or closures.  Dermatology Medication Tips: Please keep the boxes that topical medications come in in order to help keep track of the instructions about where and how to use these. Pharmacies typically print the medication instructions only on the boxes and not directly on the medication tubes.   If your medication is too expensive, please contact our office at [phone] option 4 or send us a message through MyChart.   We are unable to tell what your co-pay for medications will be in advance as this is different depending on your insurance coverage. However, we may be able to find a substitute medication at lower cost or fill out paperwork to get insurance to cover a needed medication.   If a prior authorization is required to get your medication covered by your insurance company, please allow us 1-2 business days to complete this process.  Drug prices often vary depending on where the prescription is filled and some pharmacies may offer cheaper prices.  The website www.goodrx.com contains coupons for medications through different pharmacies. The prices here do not account for what the cost may be with help from insurance (it may be cheaper with your insurance), but the website can   give you the price if you did not use any insurance.  - You can print the associated coupon and take it with your prescription to the pharmacy.  - You may also stop by our office during regular business hours and pick up a GoodRx coupon card.  - If you need your prescription sent electronically to a different pharmacy, notify our office through [facility] MyChart or by phone at [phone] option 4.     Si Usted Necesita Algo Despus de Su Visita  Tambin puede  enviarnos un mensaje a travs de MyChart. Por lo general respondemos a los mensajes de MyChart en el transcurso de 1 a 2 das hbiles.  Para renovar recetas, por favor pida a su farmacia que se ponga en contacto con nuestra oficina. Nuestro nmero de fax es el [phone].  Si tiene un asunto urgente cuando la clnica est cerrada y que no puede esperar hasta el siguiente da hbil, puede llamar/localizar a su doctor(a) al nmero que aparece a continuacin.   Por favor, tenga en cuenta que aunque hacemos todo lo posible para estar disponibles para asuntos urgentes fuera del horario de oficina, no estamos disponibles las 24 horas del da, los 7 das de la semana.   Si tiene un problema urgente y no puede comunicarse con nosotros, puede optar por buscar atencin mdica  en el consultorio de su doctor(a), en una clnica privada, en un centro de atencin urgente o en una sala de emergencias.  Si tiene una emergencia mdica, por favor llame inmediatamente al 911 o vaya a la sala de emergencias.  Nmeros de bper  - Dr. Kowalski: [phone]  - Dra. Moye: [phone]  - Dra. Stewart: [phone]  En caso de inclemencias del tiempo, por favor llame a nuestra lnea principal al [phone] para una actualizacin sobre el estado de cualquier retraso o cierre.  Consejos para la medicacin en dermatologa: Por favor, guarde las cajas en las que vienen los medicamentos de uso tpico para ayudarle a seguir las instrucciones sobre dnde y cmo usarlos. Las farmacias generalmente imprimen las instrucciones del medicamento slo en las cajas y no directamente en los tubos del medicamento.   Si su medicamento es muy caro, por favor, pngase en contacto con nuestra oficina llamando al [phone] y presione la opcin 4 o envenos un mensaje a travs de MyChart.   No podemos decirle cul ser su copago por los medicamentos por adelantado ya que esto es diferente dependiendo de la cobertura de su seguro.  Sin embargo, es posible que podamos encontrar un medicamento sustituto a menor costo o llenar un formulario para que el seguro cubra el medicamento que se considera necesario.   Si se requiere una autorizacin previa para que su compaa de seguros cubra su medicamento, por favor permtanos de 1 a 2 das hbiles para completar este proceso.  Los precios de los medicamentos varan con frecuencia dependiendo del lugar de dnde se surte la receta y alguna farmacias pueden ofrecer precios ms baratos.  El sitio web www.goodrx.com tiene cupones para medicamentos de diferentes farmacias. Los precios aqu no tienen en cuenta lo que podra costar con la ayuda del seguro (puede ser ms barato con su seguro), pero el sitio web puede darle el precio si no utiliz ningn seguro.  - Puede imprimir el cupn correspondiente y llevarlo con su receta a la farmacia.  - Tambin puede pasar por nuestra oficina durante el horario de atencin regular y recoger una tarjeta de cupones de GoodRx.  -   Si necesita que su receta se enve electrnicamente a una farmacia diferente, informe a nuestra oficina a travs de MyChart de [place] o por telfono llamando al [phone] y presione la opcin 4.  

## 2022-06-25 NOTE — Progress Notes (Signed)
Follow-Up Visit   Subjective  Zachary Gardner is a 77 y.o. male who presents for the following: Skin Cancer Screening and Upper Body Skin Exam  The patient presents for Upper Body Skin Exam (UBSE) for skin cancer screening and mole check. The patient has spots, moles and lesions to be evaluated, some may be new or changing and the patient has concerns that these could be cancer.  New spot on abdomen to check, gets rubbed by clothes.    The following portions of the chart were reviewed this encounter and updated as appropriate: medications, allergies, medical history  Review of Systems:  No other skin or systemic complaints except as noted in HPI or Assessment and Plan.  Objective  Well appearing patient in no apparent distress; mood and affect are within normal limits.  All skin waist up examined. Relevant physical exam findings are noted in the Assessment and Plan.  L lower abdomen x 1 Erythematous stuck-on, waxy papule  R ear helix x 1, L ear helix x 1, L forehead x 1 (3) Pink scaly macules/papules     Assessment & Plan   SEBORRHEIC DERMATITIS Exam: Scalp clear today.  Chronic condition with duration or expected duration over one year. Currently well-controlled.  Seborrheic Dermatitis is a chronic persistent rash characterized by pinkness and scaling most commonly of the mid face but also can occur on the scalp (dandruff), ears; mid chest, mid back and groin.  It tends to be exacerbated by stress and cooler weather.  People who have neurologic disease may experience new onset or exacerbation of existing seborrheic dermatitis.  The condition is not curable but treatable and can be controlled.  Treatment Plan: Continue Ketoconazole 2% shampoo massage into scalp 2x/wk and let sit several minutes before rinsing.   Inflamed seborrheic keratosis L lower abdomen x 1  Symptomatic, irritating, patient would like treated.  Destruction of lesion - L lower abdomen x  1  Destruction method: cryotherapy   Informed consent: discussed and consent obtained   Lesion destroyed using liquid nitrogen: Yes   Region frozen until ice ball extended beyond lesion: Yes   Outcome: patient tolerated procedure well with no complications   Post-procedure details: wound care instructions given   Additional details:  Prior to procedure, discussed risks of blister formation, small wound, skin dyspigmentation, or rare scar following cryotherapy. Recommend Vaseline ointment to treated areas while healing.   Seborrheic dermatitis  Related Medications ketoconazole (NIZORAL) 2 % shampoo Apply 1 Application topically 2 (two) times a week. Wash scalp 2 times weekly, let sit 5 minutes and rinse out  AK (actinic keratosis) (3) R ear helix x 1, L ear helix x 1, L forehead x 1  Actinic keratoses are precancerous spots that appear secondary to cumulative UV radiation exposure/sun exposure over time. They are chronic with expected duration over 1 year. A portion of actinic keratoses will progress to squamous cell carcinoma of the skin. It is not possible to reliably predict which spots will progress to skin cancer and so treatment is recommended to prevent development of skin cancer.  Recommend daily broad spectrum sunscreen SPF 30+ to sun-exposed areas, reapply every 2 hours as needed.  Recommend staying in the shade or wearing long sleeves, sun glasses (UVA+UVB protection) and wide brim hats (4-inch brim around the entire circumference of the hat). Call for new or changing lesions.  Destruction of lesion - R ear helix x 1, L ear helix x 1, L forehead x 1  Destruction  method: cryotherapy   Informed consent: discussed and consent obtained   Lesion destroyed using liquid nitrogen: Yes   Region frozen until ice ball extended beyond lesion: Yes   Outcome: patient tolerated procedure well with no complications   Post-procedure details: wound care instructions given   Additional  details:  Prior to procedure, discussed risks of blister formation, small wound, skin dyspigmentation, or rare scar following cryotherapy. Recommend Vaseline ointment to treated areas while healing.    Lentigines, Seborrheic Keratoses, Hemangiomas - Benign normal skin lesions - Benign-appearing - Call for any changes  Melanocytic Nevi - Tan-brown and/or pink-flesh-colored symmetric macules and papules - 1.5 mm medium dark brown macule at left upper back, stable  - 2 mm flesh papule at right lower eyelid, no changes per pt. No change compared to photo.  - Benign appearing on exam today - Observation - Call clinic for new or changing moles - Recommend daily use of broad spectrum spf 30+ sunscreen to sun-exposed areas.   Actinic Damage - Chronic condition, secondary to cumulative UV/sun exposure - diffuse scaly erythematous macules with underlying dyspigmentation - Recommend daily broad spectrum sunscreen SPF 30+ to sun-exposed areas, reapply every 2 hours as needed.  - Staying in the shade or wearing long sleeves, sun glasses (UVA+UVB protection) and wide brim hats (4-inch brim around the entire circumference of the hat) are also recommended for sun protection.  - Call for new or changing lesions.  Skin cancer screening performed today.  History of Dysplastic Nevus - No evidence of recurrence today of the left abdomen, exc 03/2019. - Recommend regular full body skin exams - Recommend daily broad spectrum sunscreen SPF 30+ to sun-exposed areas, reapply every 2 hours as needed.  - Call if any new or changing lesions are noted between office visits   Purpura - Chronic; persistent and recurrent.  Treatable, but not curable. - Violaceous macules and patches - Benign - Related to trauma, age, sun damage and/or use of blood thinners, chronic use of topical and/or oral steroids - Observe - Can use OTC arnica containing moisturizer such as Dermend Bruise Formula if desired - Call for  worsening or other concerns   Return in about 6 months (around 12/25/2022) for AKs, Hx Dysplastic Nevus.  ICherlyn Labella, CMA, am acting as scribe for Willeen Niece, MD .   Documentation: I have reviewed the above documentation for accuracy and completeness, and I agree with the above.  Willeen Niece, MD

## 2022-08-22 ENCOUNTER — Ambulatory Visit: Payer: Medicare HMO | Admitting: Urology

## 2022-08-22 ENCOUNTER — Encounter: Payer: Self-pay | Admitting: Urology

## 2022-08-22 VITALS — BP 164/78 | HR 66 | Ht 68.5 in | Wt 137.0 lb

## 2022-08-22 DIAGNOSIS — R3915 Urgency of urination: Secondary | ICD-10-CM | POA: Diagnosis not present

## 2022-08-22 DIAGNOSIS — Z09 Encounter for follow-up examination after completed treatment for conditions other than malignant neoplasm: Secondary | ICD-10-CM

## 2022-08-22 DIAGNOSIS — Z87438 Personal history of other diseases of male genital organs: Secondary | ICD-10-CM | POA: Diagnosis not present

## 2022-08-22 DIAGNOSIS — N401 Enlarged prostate with lower urinary tract symptoms: Secondary | ICD-10-CM

## 2022-08-22 LAB — BLADDER SCAN AMB NON-IMAGING: Scan Result: 72

## 2022-08-22 MED ORDER — TROSPIUM CHLORIDE 20 MG PO TABS: 20.0000 mg | ORAL_TABLET | Freq: Two times a day (BID) | ORAL | 0 refills | Status: DC

## 2022-08-22 NOTE — Progress Notes (Signed)
I, Duke Salvia, acting as a scribe for Zachary Altes, MD., have documented all relevant documentation on the behalf of Zachary Altes, MD, as directed by  Zachary Altes, MD while in the presence of Zachary Altes, MD.   08/22/2022 1:38 PM   Zachary Gardner 03-10-1945 [id number]  Referring provider: Kandyce Rud, MD [phone] [address] [facility] [place],  [place] [address]  Chief Complaint  Patient presents with   Follow-up   Benign Prostatic Hypertrophy    Urologic history:  1.  BPH with LUTS Acute urinary retention after inguinal herniorrhaphy Elected UroLift performed 11/17/2019   HPI: 77 y.o. male presents for annual follow-up  No significant problems since last year's visit. He will occasionally have urgency when he is out working in the yard and delays going when he feels bladder fullness Remains on Tropsium 20 mg twice daily Doing well since last visit No bothersome LUTS Denies dysuria, gross hematuria Denies flank, abdominal or pelvic pain    PMH: Past Medical History:  Diagnosis Date   Bipolar disorder (HCC)    Dysplastic nevus 03/30/2019   Left abdomen, moderate to severa atypia, excision    GERD (gastroesophageal reflux disease)    Hypertension     Surgical History: Past Surgical History:  Procedure Laterality Date   COLONOSCOPY     COLONOSCOPY N/A 11/01/2021   Procedure: COLONOSCOPY;  Surgeon: Toledo, Boykin Nearing, MD;  Location: [facility] ENDOSCOPY;  Service: Gastroenterology;  Laterality: N/A;   CYST EXCISION     BACK OF LEG   CYSTOSCOPY WITH INSERTION OF UROLIFT N/A 11/17/2019   Procedure: CYSTOSCOPY WITH INSERTION OF UROLIFT;  Surgeon: Zachary Altes, MD;  Location: [facility] ORS;  Service: Urology;  Laterality: N/A;   HERNIA REPAIR  2004 AND 07-2019   [facility]    Home Medications:  Allergies as of 08/22/2022       Reactions   Lactose Diarrhea   Lactose Intolerance (gi)          Medication List        Accurate as of August 22, 2022  1:38 PM. If you have any questions, ask your nurse or doctor.          aspirin 81 MG chewable tablet Chew by mouth.   cholecalciferol 25 MCG (1000 UNIT) tablet Commonly known as: VITAMIN D3 Take 1,000 Units by mouth daily.   cyanocobalamin 1000 MCG tablet Commonly known as: VITAMIN B12 Take 1,000 mcg by mouth daily.   finasteride 5 MG tablet Commonly known as: PROSCAR Take 1 tablet by mouth daily.   ketoconazole 2 % shampoo Commonly known as: NIZORAL Apply 1 Application topically 2 (two) times a week. Wash scalp 2 times weekly, let sit 5 minutes and rinse out   lisinopril 10 MG tablet Commonly known as: ZESTRIL Take by mouth.   lithium carbonate 300 MG ER tablet Commonly known as: LITHOBID Take 1 tablet (300 mg total) by mouth at bedtime.   multivitamin with minerals Tabs tablet Take 1 tablet by mouth daily.   omeprazole 20 MG capsule Commonly known as: PRILOSEC Take by mouth.   simvastatin 20 MG tablet Commonly known as: ZOCOR Take 20 mg by mouth daily.   trospium 20 MG tablet Commonly known as: SANCTURA Take 1 tablet (20 mg total) by mouth 2 (two) times daily.        Allergies:  Allergies  Allergen Reactions  Lactose Diarrhea   Lactose Intolerance (Gi)     Social History:  reports that he has never smoked. He has never used smokeless tobacco. He reports that he does not drink alcohol and does not use drugs.   Physical Exam: BP (!) 164/78   Pulse 66   Ht 5' 8.5" (1.74 m)   Wt 137 lb (62.1 kg)   BMI 20.53 kg/m   Constitutional:  Alert, No acute distress. HEENT: [place] AT, moist mucus membranes.  Trachea midline, no masses. Cardiovascular: No clubbing, cyanosis, or edema. Respiratory: Normal respiratory effort, no increased work of breathing.   Assessment & Plan:    1.  BPH with LUTS PVR 72 mL. Tropsium refilled. Continue annual follow up.     [facility] [address], [address] [place], [place] [address] [phone]

## 2022-09-18 ENCOUNTER — Other Ambulatory Visit: Payer: Self-pay | Admitting: Urology

## 2022-09-18 DIAGNOSIS — N401 Enlarged prostate with lower urinary tract symptoms: Secondary | ICD-10-CM

## 2023-01-01 ENCOUNTER — Ambulatory Visit: Payer: Medicare HMO | Admitting: Dermatology

## 2023-01-07 ENCOUNTER — Ambulatory Visit: Payer: Medicare HMO | Admitting: Dermatology

## 2023-01-07 ENCOUNTER — Other Ambulatory Visit: Payer: Self-pay | Admitting: Dermatology

## 2023-01-07 ENCOUNTER — Encounter: Payer: Self-pay | Admitting: Dermatology

## 2023-01-07 DIAGNOSIS — L570 Actinic keratosis: Secondary | ICD-10-CM

## 2023-01-07 DIAGNOSIS — L723 Sebaceous cyst: Secondary | ICD-10-CM | POA: Diagnosis not present

## 2023-01-07 DIAGNOSIS — L578 Other skin changes due to chronic exposure to nonionizing radiation: Secondary | ICD-10-CM

## 2023-01-07 DIAGNOSIS — L821 Other seborrheic keratosis: Secondary | ICD-10-CM

## 2023-01-07 DIAGNOSIS — L814 Other melanin hyperpigmentation: Secondary | ICD-10-CM | POA: Diagnosis not present

## 2023-01-07 DIAGNOSIS — W908XXA Exposure to other nonionizing radiation, initial encounter: Secondary | ICD-10-CM | POA: Diagnosis not present

## 2023-01-07 MED ORDER — MOMETASONE FUROATE 0.1 % EX CREA: 1.0000 | TOPICAL_CREAM | CUTANEOUS | 0 refills | Status: DC

## 2023-01-07 NOTE — Patient Instructions (Addendum)

## 2023-01-07 NOTE — Progress Notes (Signed)
Follow-Up Visit   Subjective  Zachary Gardner is a 77 y.o. male who presents for the following: AK 57m f/u, face, scalp, ears, check spot L dorsum hand ~3days, itchy and tender The patient has spots, moles and lesions to be evaluated, some may be new or changing and the patient may have concern these could be cancer.   The following portions of the chart were reviewed this encounter and updated as appropriate: medications, allergies, medical history  Review of Systems:  No other skin or systemic complaints except as noted in HPI or Assessment and Plan.  Objective  Well appearing patient in no apparent distress; mood and affect are within normal limits.   A focused examination was performed of the following areas: Face, scalp, ears  Relevant exam findings are noted in the Assessment and Plan.  posterior crown x 4 Pink scaly macules    Assessment & Plan   ACTINIC DAMAGE - chronic, secondary to cumulative UV radiation exposure/sun exposure over time - diffuse scaly erythematous macules with underlying dyspigmentation - Recommend daily broad spectrum sunscreen SPF 30+ to sun-exposed areas, reapply every 2 hours as needed.  - Recommend staying in the shade or wearing long sleeves, sun glasses (UVA+UVB protection) and wide brim hats (4-inch brim around the entire circumference of the hat). - Call for new or changing lesions.   AK (actinic keratosis) posterior crown x 4  Actinic keratoses are precancerous spots that appear secondary to cumulative UV radiation exposure/sun exposure over time. They are chronic with expected duration over 1 year. A portion of actinic keratoses will progress to squamous cell carcinoma of the skin. It is not possible to reliably predict which spots will progress to skin cancer and so treatment is recommended to prevent development of skin cancer.  Recommend daily broad spectrum sunscreen SPF 30+ to sun-exposed areas, reapply every 2 hours as needed.   Recommend staying in the shade or wearing long sleeves, sun glasses (UVA+UVB protection) and wide brim hats (4-inch brim around the entire circumference of the hat). Call for new or changing lesions.  Destruction of lesion - posterior crown x 4  Destruction method: cryotherapy   Informed consent: discussed and consent obtained   Lesion destroyed using liquid nitrogen: Yes   Region frozen until ice ball extended beyond lesion: Yes   Outcome: patient tolerated procedure well with no complications   Post-procedure details: wound care instructions given   Additional details:  Prior to procedure, discussed risks of blister formation, small wound, skin dyspigmentation, or rare scar following cryotherapy. Recommend Vaseline ointment to treated areas while healing.    INFLAMED CYST VS BITE REACTION L dorsum hand Exam: 1.0cm flat firm nodule, present for 3 days, tender  Treatment Plan: Start Mometasone cr bid/tid until resolved If does not resolve will consider bx  Topical steroids (such as triamcinolone, fluocinolone, fluocinonide, mometasone, clobetasol, halobetasol, betamethasone, hydrocortisone) can cause thinning and lightening of the skin if they are used for too long in the same area. Your physician has selected the right strength medicine for your problem and area affected on the body. Please use your medication only as directed by your physician to prevent side effects.   SEBORRHEIC KERATOSIS face - Stuck-on, waxy, tan-brown papules and/or plaques  - Benign-appearing - Discussed benign etiology and prognosis. - Observe - Call for any changes  LENTIGINES face Exam: scattered tan macules Due to sun exposure Treatment Plan: Benign-appearing, observe. Recommend daily broad spectrum sunscreen SPF 30+ to sun-exposed areas, reapply every  2 hours as needed.  Call for any changes   Return in about 6 months (around 07/07/2023) for UBSE, Hx of Dysplastic nevi, Hx of AKs.  I, Ardis Rowan, RMA, am acting as scribe for Willeen Niece, MD .   Documentation: I have reviewed the above documentation for accuracy and completeness, and I agree with the above.  Willeen Niece, MD

## 2023-01-17 ENCOUNTER — Other Ambulatory Visit: Payer: Self-pay | Admitting: Urology

## 2023-01-17 DIAGNOSIS — N401 Enlarged prostate with lower urinary tract symptoms: Secondary | ICD-10-CM

## 2023-02-21 ENCOUNTER — Other Ambulatory Visit: Payer: Self-pay | Admitting: Urology

## 2023-02-21 DIAGNOSIS — N401 Enlarged prostate with lower urinary tract symptoms: Secondary | ICD-10-CM

## 2023-03-19 ENCOUNTER — Other Ambulatory Visit: Payer: Self-pay | Admitting: Urology

## 2023-03-19 DIAGNOSIS — R3915 Urgency of urination: Secondary | ICD-10-CM

## 2023-05-14 ENCOUNTER — Other Ambulatory Visit: Payer: Self-pay

## 2023-05-14 DIAGNOSIS — N401 Enlarged prostate with lower urinary tract symptoms: Secondary | ICD-10-CM

## 2023-05-14 MED ORDER — TROSPIUM CHLORIDE 20 MG PO TABS: 20.0000 mg | ORAL_TABLET | Freq: Two times a day (BID) | ORAL | 2 refills | Status: DC

## 2023-07-09 ENCOUNTER — Ambulatory Visit: Payer: Medicare HMO | Admitting: Dermatology

## 2023-07-10 ENCOUNTER — Ambulatory Visit: Admitting: Dermatology

## 2023-07-10 DIAGNOSIS — Z1283 Encounter for screening for malignant neoplasm of skin: Secondary | ICD-10-CM

## 2023-07-10 DIAGNOSIS — L219 Seborrheic dermatitis, unspecified: Secondary | ICD-10-CM | POA: Diagnosis not present

## 2023-07-10 DIAGNOSIS — Z86018 Personal history of other benign neoplasm: Secondary | ICD-10-CM

## 2023-07-10 DIAGNOSIS — L570 Actinic keratosis: Secondary | ICD-10-CM

## 2023-07-10 DIAGNOSIS — L821 Other seborrheic keratosis: Secondary | ICD-10-CM

## 2023-07-10 DIAGNOSIS — W908XXA Exposure to other nonionizing radiation, initial encounter: Secondary | ICD-10-CM

## 2023-07-10 DIAGNOSIS — D692 Other nonthrombocytopenic purpura: Secondary | ICD-10-CM

## 2023-07-10 DIAGNOSIS — L578 Other skin changes due to chronic exposure to nonionizing radiation: Secondary | ICD-10-CM

## 2023-07-10 DIAGNOSIS — L820 Inflamed seborrheic keratosis: Secondary | ICD-10-CM

## 2023-07-10 DIAGNOSIS — L814 Other melanin hyperpigmentation: Secondary | ICD-10-CM

## 2023-07-10 DIAGNOSIS — D229 Melanocytic nevi, unspecified: Secondary | ICD-10-CM

## 2023-07-10 NOTE — Progress Notes (Signed)
 Follow-Up Visit   Subjective  Zachary Gardner is a 78 y.o. male who presents for the following: Skin Cancer Screening and Upper Body Skin Exam. Patient with hx of dysplastic nevus. Place at R ear patient reports his dentist noticed it, not bothersome to patient.   The patient presents for Upper Body Skin Exam (UBSE) for skin cancer screening and mole check. The patient has spots, moles and lesions to be evaluated, some may be new or changing and the patient may have concern these could be cancer.   The following portions of the chart were reviewed this encounter and updated as appropriate: medications, allergies, medical history  Review of Systems:  No other skin or systemic complaints except as noted in HPI or Assessment and Plan.  Objective  Well appearing patient in no apparent distress; mood and affect are within normal limits.  All skin waist up examined. Relevant physical exam findings are noted in the Assessment and Plan.  Vertex scalp x4 (4) Pink scaly macules R wrist x1, L clavicle x1 (2) Stuck on waxy papules with crusting  Assessment & Plan   AK (ACTINIC KERATOSIS) (4) Vertex scalp x4 (4) Actinic keratoses are precancerous spots that appear secondary to cumulative UV radiation exposure/sun exposure over time. They are chronic with expected duration over 1 year. A portion of actinic keratoses will progress to squamous cell carcinoma of the skin. It is not possible to reliably predict which spots will progress to skin cancer and so treatment is recommended to prevent development of skin cancer.  Recommend daily broad spectrum sunscreen SPF 30+ to sun-exposed areas, reapply every 2 hours as needed.  Recommend staying in the shade or wearing long sleeves, sun glasses (UVA+UVB protection) and wide brim hats (4-inch brim around the entire circumference of the hat). Call for new or changing lesions. Destruction of lesion - Vertex scalp x4 (4)  Destruction method: cryotherapy    Informed consent: discussed and consent obtained   Lesion destroyed using liquid nitrogen: Yes   Region frozen until ice ball extended beyond lesion: Yes   Outcome: patient tolerated procedure well with no complications   Post-procedure details: wound care instructions given   Additional details:  Prior to procedure, discussed risks of blister formation, small wound, skin dyspigmentation, or rare scar following cryotherapy. Recommend Vaseline ointment to treated areas while healing.  INFLAMED SEBORRHEIC KERATOSIS (2) R wrist x1, L clavicle x1 (2) Symptomatic, irritating, patient would like treated. Destruction of lesion - R wrist x1, L clavicle x1 (2)  Destruction method: cryotherapy   Informed consent: discussed and consent obtained   Lesion destroyed using liquid nitrogen: Yes   Region frozen until ice ball extended beyond lesion: Yes   Outcome: patient tolerated procedure well with no complications   Post-procedure details: wound care instructions given   Additional details:  Prior to procedure, discussed risks of blister formation, small wound, skin dyspigmentation, or rare scar following cryotherapy. Recommend Vaseline ointment to treated areas while healing.   Skin cancer screening performed today.  Actinic Damage - Chronic condition, secondary to cumulative UV/sun exposure - diffuse scaly erythematous macules with underlying dyspigmentation - Recommend daily broad spectrum sunscreen SPF 30+ to sun-exposed areas, reapply every 2 hours as needed.  - Staying in the shade or wearing long sleeves, sun glasses (UVA+UVB protection) and wide brim hats (4-inch brim around the entire circumference of the hat) are also recommended for sun protection.  - Call for new or changing lesions.  Lentigines, Seborrheic Keratoses, Hemangiomas -  Benign normal skin lesions - Benign-appearing - Call for any changes  LENTIGINES Exam: scattered tan macules at R ear  Due to sun exposure Treatment  Plan: Benign-appearing, observe. Recommend daily broad spectrum sunscreen SPF 30+ to sun-exposed areas, reapply every 2 hours as needed.  Call for any changes   Melanocytic Nevi - Tan-brown and/or pink-flesh-colored symmetric macules and papules - 1.5 mm medium dark brown macule at left upper back, stable  - 2.5 mm flesh papule at right lower eyelid, no changes per pt. No change compared to photo. - Benign appearing on exam today - Observation - Call clinic for new or changing moles - Recommend daily use of broad spectrum spf 30+ sunscreen to sun-exposed areas.   Purpura - Chronic; persistent and recurrent.  Treatable, but not curable. 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  Call if any new or changing lesions are noted between office visits   SEBORRHEIC DERMATITIS Exam: Scalp clear today.   Chronic condition with duration or expected duration over one year. Currently well-controlled.   Seborrheic Dermatitis is a chronic persistent rash characterized by pinkness and scaling most commonly of the mid face but also can occur on the scalp (dandruff), ears; mid chest, mid back and groin.  It tends to be exacerbated by stress and cooler weather.  People who have neurologic disease may experience new onset or exacerbation of existing seborrheic dermatitis.  The condition is not curable but treatable and can be controlled.   Treatment Plan: Continue Ketoconazole  2% shampoo massage  into scalp 2x/wk and let sit several minutes before rinsing.      Return in about 1 year (around 07/09/2024) for w/ Dr. Annette Barters, HxDysplastic Nevi, HxAKs, Seb Derm, Alverta Johns  I, Hector Littles, CMA, am acting as scribe for Artemio Larry, MD .   Documentation: I have reviewed the above documentation for accuracy and completeness, and I agree with the above.  Artemio Larry, MD

## 2023-07-10 NOTE — Patient Instructions (Addendum)

## 2023-08-04 ENCOUNTER — Other Ambulatory Visit: Payer: Self-pay | Admitting: Urology

## 2023-08-04 DIAGNOSIS — N401 Enlarged prostate with lower urinary tract symptoms: Secondary | ICD-10-CM

## 2023-08-21 ENCOUNTER — Encounter: Payer: Self-pay | Admitting: Urology

## 2023-08-21 ENCOUNTER — Ambulatory Visit: Payer: Self-pay | Admitting: Urology

## 2023-08-21 VITALS — BP 156/82 | HR 67 | Ht 68.0 in | Wt 145.0 lb

## 2023-08-21 DIAGNOSIS — N401 Enlarged prostate with lower urinary tract symptoms: Secondary | ICD-10-CM

## 2023-08-21 DIAGNOSIS — R3915 Urgency of urination: Secondary | ICD-10-CM

## 2023-08-21 LAB — BLADDER SCAN AMB NON-IMAGING: Scan Result: 163

## 2023-08-21 MED ORDER — TROSPIUM CHLORIDE 20 MG PO TABS: 20.0000 mg | ORAL_TABLET | Freq: Two times a day (BID) | ORAL | 0 refills | Status: DC

## 2023-08-21 MED ORDER — FINASTERIDE 5 MG PO TABS: 5.0000 mg | ORAL_TABLET | Freq: Every day | ORAL | 3 refills | Status: DC

## 2023-08-21 NOTE — Progress Notes (Signed)
 08/21/2023 10:25 AM   Zachary Gardner [birth year] [id number]  Referring provider: Diedra Lame, MD [phone] [address] [facility] [place],  [place] [address]  Chief Complaint  Patient presents with   Benign Prostatic Hypertrophy    Urologic history:  1.  BPH with LUTS Acute urinary retention after inguinal herniorrhaphy Elected UroLift performed 11/17/2019   HPI: 78 y.o. male presents for annual follow-up  States he is doing well and voiding symptoms have continued to improve Remains on Tropsium 20 mg twice daily Remains on finasteride  prescribed by PCP Denies dysuria, gross hematuria Denies flank, abdominal or pelvic pain    PMH: Past Medical History:  Diagnosis Date   Actinic keratosis    Bipolar disorder (HCC)    Dysplastic nevus 03/30/2019   Left abdomen, moderate to severa atypia, excision    GERD (gastroesophageal reflux disease)    Hypertension     Surgical History: Past Surgical History:  Procedure Laterality Date   COLONOSCOPY     COLONOSCOPY N/A 11/01/2021   Procedure: COLONOSCOPY;  Surgeon: Toledo, Ladell POUR, MD;  Location: [facility] ENDOSCOPY;  Service: Gastroenterology;  Laterality: N/A;   CYST EXCISION     BACK OF LEG   CYSTOSCOPY WITH INSERTION OF UROLIFT N/A 11/17/2019   Procedure: CYSTOSCOPY WITH INSERTION OF UROLIFT;  Surgeon: Twylla Zachary BROCKS, MD;  Location: [facility] ORS;  Service: Urology;  Laterality: N/A;   HERNIA REPAIR  2004 AND 07-2019   [facility]    Home Medications:  Allergies as of 08/21/2023       Reactions   Lactose Diarrhea   Lactose Intolerance (gi)         Medication List        Accurate as of August 21, 2023 10:25 AM. If you have any questions, ask your nurse or doctor.          amLODipine 10 MG tablet Commonly known as: NORVASC Take 5 mg by mouth.   aspirin 81 MG chewable tablet Chew by mouth.   cholecalciferol  25 MCG (1000 UNIT) tablet Commonly  known as: VITAMIN D3 Take 1,000 Units by mouth daily.   cyanocobalamin  1000 MCG tablet Commonly known as: VITAMIN B12 Take 1,000 mcg by mouth daily.   finasteride  5 MG tablet Commonly known as: PROSCAR  Take 1 tablet by mouth daily.   ketoconazole  2 % shampoo Commonly known as: NIZORAL  Apply 1 Application topically 2 (two) times a week. Wash scalp 2 times weekly, let sit 5 minutes and rinse out   lisinopril 10 MG tablet Commonly known as: ZESTRIL Take by mouth.   lithium  carbonate 300 MG ER tablet Commonly known as: LITHOBID  Take 1 tablet (300 mg total) by mouth at bedtime.   mometasone  0.1 % cream Commonly known as: ELOCON  Apply topically daily. Bid to tid to itchy spot on left hand until resolved   multivitamin with minerals Tabs tablet Take 1 tablet by mouth daily.   omeprazole 20 MG capsule Commonly known as: PRILOSEC Take by mouth.   simvastatin  20 MG tablet Commonly known as: ZOCOR  Take 20 mg by mouth daily.   trospium  20 MG tablet Commonly known as: SANCTURA  Take 1 tablet by mouth twice daily        Allergies:  Allergies  Allergen Reactions   Lactose Diarrhea   Lactose Intolerance (Gi)     Social History:  reports that he has never smoked. He has never used smokeless tobacco.  He reports that he does not drink alcohol and does not use drugs.   Physical Exam: BP (!) 156/82   Pulse 67   Ht 5' 8 (1.727 m)   Wt 145 lb (65.8 kg)   BMI 22.05 kg/m   Constitutional:  Alert, No acute distress. HEENT: [place] AT Respiratory: Normal respiratory effort, no increased work of breathing.   Assessment & Plan:    1.  BPH with LUTS Doing well status post UroLift PVR 163 mL. Tropsium refilled. Continue annual follow up with PVR.   Zachary JAYSON Barba, MD  [facility] [address], [address] [place], [place] [address] [phone]

## 2023-10-03 ENCOUNTER — Other Ambulatory Visit: Payer: Self-pay | Admitting: Urology

## 2023-10-03 DIAGNOSIS — N401 Enlarged prostate with lower urinary tract symptoms: Secondary | ICD-10-CM

## 2023-10-28 ENCOUNTER — Other Ambulatory Visit: Payer: Self-pay | Admitting: Urology

## 2023-10-28 DIAGNOSIS — N401 Enlarged prostate with lower urinary tract symptoms: Secondary | ICD-10-CM

## 2023-11-28 ENCOUNTER — Other Ambulatory Visit: Payer: Self-pay | Admitting: Urology

## 2023-11-28 DIAGNOSIS — N401 Enlarged prostate with lower urinary tract symptoms: Secondary | ICD-10-CM

## 2023-12-28 ENCOUNTER — Other Ambulatory Visit: Payer: Self-pay | Admitting: Urology

## 2023-12-28 DIAGNOSIS — N401 Enlarged prostate with lower urinary tract symptoms: Secondary | ICD-10-CM

## 2024-01-30 ENCOUNTER — Other Ambulatory Visit: Payer: Self-pay | Admitting: Urology

## 2024-01-30 DIAGNOSIS — N401 Enlarged prostate with lower urinary tract symptoms: Secondary | ICD-10-CM

## 2024-02-26 ENCOUNTER — Other Ambulatory Visit: Payer: Self-pay | Admitting: Urology

## 2024-02-26 DIAGNOSIS — N401 Enlarged prostate with lower urinary tract symptoms: Secondary | ICD-10-CM

## 2024-07-13 ENCOUNTER — Ambulatory Visit: Admitting: Dermatology

## 2024-08-19 ENCOUNTER — Ambulatory Visit: Admitting: Urology
